# Patient Record
Sex: Male | Born: 1960 | Race: Black or African American | Hispanic: No | Marital: Married | Smoking: Never smoker
Health system: Southern US, Community
[De-identification: ages and names within clinical notes are randomized; demographics above are authoritative.]

## PROBLEM LIST (undated history)

## (undated) DIAGNOSIS — E785 Hyperlipidemia, unspecified: Secondary | ICD-10-CM

## (undated) DIAGNOSIS — I2510 Atherosclerotic heart disease of native coronary artery without angina pectoris: Secondary | ICD-10-CM

## (undated) DIAGNOSIS — R931 Abnormal findings on diagnostic imaging of heart and coronary circulation: Secondary | ICD-10-CM

## (undated) DIAGNOSIS — I10 Essential (primary) hypertension: Secondary | ICD-10-CM

## (undated) HISTORY — PX: BACK SURGERY: SHX140

## (undated) HISTORY — DX: Essential (primary) hypertension: I10

## (undated) HISTORY — DX: Abnormal findings on diagnostic imaging of heart and coronary circulation: R93.1

## (undated) HISTORY — DX: Atherosclerotic heart disease of native coronary artery without angina pectoris: I25.10

## (undated) HISTORY — DX: Hyperlipidemia, unspecified: E78.5

---

## 1999-01-10 ENCOUNTER — Encounter: Payer: Self-pay | Admitting: Cardiology

## 1999-01-10 ENCOUNTER — Ambulatory Visit (HOSPITAL_COMMUNITY): Admission: RE | Admit: 1999-01-10 | Discharge: 1999-01-10 | Payer: Self-pay | Admitting: Cardiology

## 1999-10-23 ENCOUNTER — Ambulatory Visit (HOSPITAL_COMMUNITY): Admission: RE | Admit: 1999-10-23 | Discharge: 1999-10-23 | Payer: Self-pay | Admitting: Cardiology

## 1999-10-23 ENCOUNTER — Encounter: Payer: Self-pay | Admitting: Cardiology

## 2001-12-24 ENCOUNTER — Encounter (INDEPENDENT_AMBULATORY_CARE_PROVIDER_SITE_OTHER): Payer: Self-pay | Admitting: Specialist

## 2001-12-24 ENCOUNTER — Encounter: Payer: Self-pay | Admitting: Specialist

## 2001-12-24 ENCOUNTER — Inpatient Hospital Stay (HOSPITAL_COMMUNITY): Admission: RE | Admit: 2001-12-24 | Discharge: 2001-12-25 | Payer: Self-pay | Admitting: Specialist

## 2003-05-31 ENCOUNTER — Ambulatory Visit (HOSPITAL_COMMUNITY): Admission: RE | Admit: 2003-05-31 | Discharge: 2003-05-31 | Payer: Self-pay | Admitting: Cardiology

## 2003-05-31 ENCOUNTER — Encounter: Payer: Self-pay | Admitting: Cardiology

## 2006-05-16 ENCOUNTER — Encounter: Admission: RE | Admit: 2006-05-16 | Discharge: 2006-05-16 | Payer: Self-pay | Admitting: *Deleted

## 2007-09-19 ENCOUNTER — Encounter: Admission: RE | Admit: 2007-09-19 | Discharge: 2007-09-19 | Payer: Self-pay | Admitting: Cardiology

## 2010-09-24 ENCOUNTER — Encounter: Payer: Self-pay | Admitting: Cardiology

## 2011-01-19 NOTE — H&P (Signed)
[facility]  Patient:    Roberto Miranda, Roberto Miranda Visit Number: [id number] MRN: [id number]          Service Type: Attending:  Javier Docker, M.D. Dictated by:   Sammuel Cooper. Mahar, P.A. Adm. Date:  12/24/01                           History and Physical  DATE OF BIRTH:  26-Feb-1961  CHIEF COMPLAINT:  Back pain and left leg paresthesias.  HISTORY OF PRESENT ILLNESS:  The patient is a 50 year old with back pain and leg "tingling" since the end of 2002.  He injured it while working at [employer].  He has failed conservative management.  It is interfering with his activities of daily living and work.  The risks and benefits of the proposed surgery were discussed with the patient by Dr. Jene Every, as well as myself.  He indicated understanding and wishes to proceed.  ALLERGIES:  No known drug allergies.  MEDICATIONS: 1. Celebrex 200 mg p.o. p.r.n. 2. Vicodin p.r.n.  PAST MEDICAL HISTORY: 1. Hypertension, for which he is no longer medicated secondary to hypotensive    episodes. 2. Hiatal hernia.  PAST SURGICAL HISTORY:  None.  SOCIAL HISTORY:  Denies tobacco use, denies alcohol use.  He is married, has two children, ages 13 and 2.  He works as a Loss adjuster, chartered.  FAMILY HISTORY:  Father deceased at age 2 secondary to lung cancer.  Mother alive at age 87 with diabetes and a history of stroke.  REVIEW OF SYSTEMS:  The patient denies any fevers, chills, night sweats, or bleeding tendencies.  CNS:  Denies any blurred vision, double vision, headaches, seizure, or paralysis.  CARDIOVASCULAR:  Denies any chest pain, angina, orthopnea, claudication, or palpitations.  PULMONARY:  Denies any shortness of breath, productive cough, or hemoptysis.  GASTROINTESTINAL: Denies any nausea, vomiting, constipation, diarrhea, melena, or bloody stool. GENITOURINARY:  Denies any dysuria, hematuria, or discharge.  MUSCULOSKELETAL: As per HPI.  PHYSICAL EXAMINATION:  VITAL  SIGNS:  Blood pressure 132/86, respirations 16 and unlabored, pulse 80 and regular.  GENERAL:  The patient is a 50 year old male.  He is alert and oriented.  In no acute distress.  Well-nourished, well-groomed.  Appears stated age.  Pleasant and cooperative to exam.  HEENT:  Head normocephalic, atraumatic.  Pupils equal, round, and reactive to light.  Extraocular movements intact.  Nares patent bilaterally.  Pharynx is clear, without any erythema or exudate.  NECK:  Soft, supple to palpation.  No bruits appreciated.  No lymphadenopathy noted.  No thyromegaly noted.  CHEST:  Clear to auscultation bilaterally.  No rales, rhonchi, stridor, wheezes, or friction rubs appreciated.  BREASTS:  Not pertinent, not performed.  HEART:  Regular S1, S2.  Regular rate and rhythm.  No murmurs, gallops, or rubs noted.  ABDOMEN:  Soft to palpation.  Positive bowel sounds throughout.  Nontender, nondistended.  No organomegaly noted.  GENITOURINARY:  Not pertinent, not performed.  EXTREMITIES:  Bilateral lower extremities with motor function grossly intact. Sensation is intact to light touch.  Posterior tibialis pulses are intact and equal bilaterally.  SKIN:  Intact.  Without any lesions or rashes.  LABORATORY DATA:  MRI from October 2002, shows a large disk herniation central and to the left with significant protrusion into the L5-S1 space.  IMPRESSION: 1. Herniated nucleus pulposus L5-S1. 2. Hypertension, not currently medicated. 3. Hiatal hernia.  PLAN:  Admit to [facility] on December 24, 2001, to Dr. Jene Every for a microdiskectomy at L5-S1. Dictated by:   Sammuel Cooper. Mahar, P.A. Attending:  Javier Docker, M.D. DD:  12/18/01 TD:  12/19/01 Job: [id number]

## 2011-01-19 NOTE — Op Note (Signed)
[facility]  Patient:    JENS, SIEMS Visit Number: [id number] MRN: [id number]          Service Type: SUR Location: [id number] Attending Physician:  Pierce Crane Dictated by:   Javier Docker, M.D. Proc. Date: 12/24/01 Admit Date:  12/24/2001                             Operative Report  PREOPERATIVE DIAGNOSIS:   Herniated nucleus pulposus, degenerative disk disease, lateral recess stenosis L5-S1.  POSTOPERATIVE DIAGNOSIS:  Herniated nucleus pulposus, degenerative disk disease, lateral recess stenosis L5-S1.  OPERATION:  Bilateral recess decompression, bilateral microdiskectomy.  SURGEON:  Javier Docker, M.D.  ASSISTANT:  Ottie Glazier. Wynona Neat, P.A.-C.  ANESTHESIA:  General.  INDICATIONS:  This is a 50 year old with refractory right lower extremity radicular pain.  MRI demonstrated degenerative disk disease and lateral recess stenosis and large central HNP.  Operative intervention is indicated for decompression of the S1 nerve root and lateral recess with bilateral microdiskectomy, lateral recess decompression and a hemilaminotomy.  Risks and benefits have been discussed including bleeding, infection, need for further surgery, CSF leak, epidural fibrosis, need for fusion in the future, etc.  DESCRIPTION OF PROCEDURE:  Patient in the supine position.  After the adequate administration of general anesthesia, 1 g of Kefzol, the patient was placed prone on the [place] frame.  All bony prominences were well padded.  The lumbar region was prepped and draped in the usual sterile fashion.  Two 18 gauge spinal needles were utilized to localize the L5-S1 interspace and confirmed with x-ray.  Incision was made from the spinous process of L5-S1 and the subcutaneous tissue was dissected with electrocautery to achieve hemostasis.  The tensor lata fascia was identified and divided on the outline of the inner spinous ligament.  The  paraspinous muscles were elevated from the lamina of L5 and S1.  McCullough retractor was placed.  The operating microscope was draped and brought into the surgical field.  Confirmatory radiographs were obtained with the DErrico in the intralaminar space. Ligament of flavum was detached in the cephalad edge utilizing straight curet. Hemilaminotomy was performed with 2 and 3 mm Kerrisons.  Following this a neural padding was placed beneath the ligamentum of flavum.  This was removed from the interspace.  There was hypertrophic ligament of flavum laterally. There was a slightly hypertrophic facet.  This was decompressed with a 2 mm Kerrison.  Nerve root and vessel was displaced into the lateral recess and gently mobilized immediately.  Herniated nucleus pulposus on the right was noted central and to the right.  Bipolar electrocautery was utilized to achieve hemostasis.  Epidural venous plexus was cauterized.  Hemilaminotomy was performed.  A copious amount of disk material was removed from the disk space and from beneath the subannular space.  Checking beneath the thecal sac, axilla, root of L5 and S1 foramen, S1 foraminotomy was performed.  This decompressed the S1 nerve root.  It was found to be freely mobile.  Finally, in a similar fashion we approached the left side and the intralaminar space, decompressed the lateral recess, mobilized the S1 nerve root and HNP found here.  Annulotomy produced additional herniated material from this side and from beneath the center.  We checked the axilla and the foramen.  They were widely patent.  Following this decompression, electrocautery was utilized to achieve hemostasis.  We then turned to the right and re-examined  the space. There was a fragment that was obtained.  This was removed.  I copiously irrigated the disk space bilaterally.  I reinspected the root.  It was unencumbered and freely mobile bilaterally.  L5-S1 were found to be widely. There  was an osteophytic ridge at the posterior aspect of L5 and S1 but it was not found to be significantly impinging.  No residual disk material was noted. No CSF or active bleeding.  Thrombin soaked Gelfoam was placed in the laminotomy defects bilaterally.  Patellar retractor was removed.  The paraspinous muscles were retracted.  There was no active bleeding. The dorsal lumbar fascia was then reapproximated with #1 Vicryl interrupted figure-of-eight sutures.  Subcutaneous tissues were reapproximated with interrupted 2-0 subcuticular sutures.  The skin was reapproximated with 4-0 subcuticular Prolene and Steri-Strips.  Sterile dressing applied.  The patient was placed supine on the hospital bed and extubated without difficulty and transported to the recovery room in satisfactory condition.  The patient tolerated the procedure well with no complications. Dictated by:   Javier Docker, M.D. Attending Physician:  Pierce Crane DD:  12/24/01 TD:  12/24/01 Job: [id number]

## 2011-08-04 ENCOUNTER — Other Ambulatory Visit: Payer: Self-pay | Admitting: Cardiology

## 2011-10-15 ENCOUNTER — Other Ambulatory Visit: Payer: Self-pay | Admitting: Cardiology

## 2013-08-03 ENCOUNTER — Telehealth: Payer: Self-pay

## 2013-08-03 NOTE — Telephone Encounter (Signed)
This is I/A account, please put in medman, pull chart

## 2013-08-03 NOTE — Telephone Encounter (Signed)
Patient lost his dot card and needs a replacment card when finished please call him at [phone]

## 2013-10-06 ENCOUNTER — Encounter (INDEPENDENT_AMBULATORY_CARE_PROVIDER_SITE_OTHER): Payer: Self-pay | Admitting: General Surgery

## 2013-10-09 ENCOUNTER — Telehealth (INDEPENDENT_AMBULATORY_CARE_PROVIDER_SITE_OTHER): Payer: Self-pay

## 2013-10-09 ENCOUNTER — Encounter (INDEPENDENT_AMBULATORY_CARE_PROVIDER_SITE_OTHER): Payer: Self-pay | Admitting: General Surgery

## 2013-10-09 ENCOUNTER — Ambulatory Visit (INDEPENDENT_AMBULATORY_CARE_PROVIDER_SITE_OTHER): Payer: BC Managed Care – PPO | Admitting: General Surgery

## 2013-10-09 VITALS — BP 128/82 | HR 78 | Resp 18 | Ht 70.0 in | Wt 188.0 lb

## 2013-10-09 DIAGNOSIS — D1739 Benign lipomatous neoplasm of skin and subcutaneous tissue of other sites: Secondary | ICD-10-CM

## 2013-10-09 DIAGNOSIS — D1720 Benign lipomatous neoplasm of skin and subcutaneous tissue of unspecified limb: Secondary | ICD-10-CM

## 2013-10-09 NOTE — Progress Notes (Signed)
Patient ID: Roberto Miranda, male   DOB: Jan 22, 1961, 53 y.o.   MRN: [id number]  Chief Complaint  Patient presents with  . Other    Eval lipoma rt shoulder    HPI Roberto Miranda is a 53 y.o. male.   HPI 53 yo AAM referred by Dr Antony Contras for evaluation of Right shoulder soft tissue mass. Patient states area has been there for several years however over the past several months he thinks that it may have gotten slowly enlarged. It is now causing him some discomfort. It causes discomfort around the area he also complains of some tingling going down his entire right arm. It tends to bother him only with certain movements when he raises his arm outward. He denies any fever, chills, nausea, vomiting, weight loss, night sweats. He denies any trauma to area. It has never drained anything. He denies any other soft tissue masses Past Medical History  Diagnosis Date  . Hypertension     Past Surgical History  Procedure Laterality Date  . Back surgery      History reviewed. No pertinent family history.  Social History History  Substance Use Topics  . Smoking status: Never Smoker   . Smokeless tobacco: Not on file  . Alcohol Use: No    No Known Allergies  Current Outpatient Prescriptions  Medication Sig Dispense Refill  . aspirin 81 MG tablet Take 81 mg by mouth daily.      . naproxen (NAPROSYN) 250 MG tablet Take by mouth 2 (two) times daily with a meal.      . olmesartan (BENICAR) 20 MG tablet Take 20 mg by mouth daily.       No current facility-administered medications for this visit.    Review of Systems Review of Systems  Constitutional: Negative for fever, chills, appetite change and unexpected weight change.  HENT: Negative for congestion and trouble swallowing.   Eyes: Negative for visual disturbance.  Respiratory: Negative for chest tightness and shortness of breath.   Cardiovascular: Negative for chest pain and leg swelling.       No PND, no orthopnea, no DOE    Gastrointestinal:       See HPI  Genitourinary: Negative for dysuria and hematuria.  Musculoskeletal: Negative.   Skin: Negative for rash.  Neurological: Negative for seizures and speech difficulty.  Hematological: Does not bruise/bleed easily.  Psychiatric/Behavioral: Negative for behavioral problems and confusion.    Blood pressure 128/82, pulse 78, resp. rate 18, height 5\' 10"  (1.778 m), weight 188 lb (85.276 kg).  Physical Exam Physical Exam  Constitutional: He is oriented to person, place, and time. He appears well-developed and well-nourished. No distress.  HENT:  Head: Normocephalic and atraumatic.  Right Ear: External ear normal.  Left Ear: External ear normal.  Eyes: Conjunctivae are normal. No scleral icterus.  Neck: Normal range of motion. Neck supple. No tracheal deviation present. No thyromegaly present.  Cardiovascular: Normal rate, normal heart sounds and intact distal pulses.   Pulmonary/Chest: Effort normal and breath sounds normal. No respiratory distress. He has no wheezes.    Right ant shoulder soft tissue mass, well circumscribed. Soft, NT. 4 x 4cm. No overlying skin changes. mobile  Musculoskeletal: Normal range of motion. He exhibits no edema and no tenderness.  Lymphadenopathy:    He has no cervical adenopathy.    He has no axillary adenopathy.       Right: No supraclavicular adenopathy present.       Left: No supraclavicular adenopathy present.  Neurological: He is alert and oriented to person, place, and time. He exhibits normal muscle tone.  Skin: Skin is warm and dry. No rash noted. He is not diaphoretic. No erythema. No pallor.  Psychiatric: He has a normal mood and affect. His behavior is normal. Judgment and thought content normal.    Data Reviewed Dr Laqueta Linden office note  Assessment    Right shoulder lipoma     Plan    We discussed the etiology and management of lipomas. The patient was given educational material. We discussed that the  majority of lipomas are benign although on a rare occasion it can be malignant.   We discussed observation versus surgical excision. We discussed the risks and benefits of surgery including but not limited to bleeding, infection, injury to surrounding structures, scarring, cosmetic concerns, blood clot formation, anesthesia issues, possible recurrence, and the typical postoperative course.   The patient has elected to proceed with EXCISION OF RIGHT SHOULDER SUBCUTANEOUS MASS  I did advise the patient that there is a small chance of excision the mass may not ameliorate his symptoms   Roberto Ruff. Redmond Pulling, MD, FACS General, Bariatric, & Minimally Invasive Surgery [facility], [place]        [facility] M 10/09/2013, 9:57 AM

## 2013-10-09 NOTE — Patient Instructions (Signed)

## 2013-10-09 NOTE — Telephone Encounter (Signed)
Message copied by Illene Regulus on Fri Oct 09, 2013  1:56 PM ------      Message from: Tennis Ship      Created: Fri Oct 09, 2013  1:14 PM       I met with this patient to schedule his surgery today. I tried to verify his BCBC. The e-verify on epic would not work. I went on [employer] and the website was not showing and detailed benefits other then he is an active member. I called BCBS also. I went through the automated portion entering in our NPI, and subscriber ID. The automated message said transferring to a customer service representative. Then another message came on and said... Due to a high number of phone calls we can not take you call at this time, Sorry for the inconvenience and HUNG UP ON ME.  I called the patient in my office and let him know I couldn't get benefits at this time. I let him know the estimate cost of Dr. Dois Davenport fees of 797.00 and that we take a 50% deposit when scheduling. Since I do not have his benefits I had nothing to go by. He wanted to schedule so I let him know I would take a $100.00 deposit in good faith and would call him as soon as I got his benefits. He was ok with this. Dr. Redmond Pulling and Marlowe Kays are aware of this.            Thanks,      Steph ------

## 2013-11-06 ENCOUNTER — Other Ambulatory Visit (INDEPENDENT_AMBULATORY_CARE_PROVIDER_SITE_OTHER): Payer: Self-pay | Admitting: General Surgery

## 2013-11-06 ENCOUNTER — Other Ambulatory Visit (INDEPENDENT_AMBULATORY_CARE_PROVIDER_SITE_OTHER): Payer: Self-pay | Admitting: *Deleted

## 2013-11-06 DIAGNOSIS — D1739 Benign lipomatous neoplasm of skin and subcutaneous tissue of other sites: Secondary | ICD-10-CM

## 2013-11-06 MED ORDER — OXYCODONE-ACETAMINOPHEN 5-325 MG PO TABS
1.0000 | ORAL_TABLET | ORAL | Status: AC | PRN
Start: 2013-11-06 — End: 2014-11-06

## 2019-12-10 ENCOUNTER — Other Ambulatory Visit: Payer: Self-pay

## 2019-12-10 ENCOUNTER — Ambulatory Visit (INDEPENDENT_AMBULATORY_CARE_PROVIDER_SITE_OTHER): Payer: BC Managed Care – PPO | Admitting: Allergy and Immunology

## 2019-12-10 ENCOUNTER — Encounter: Payer: Self-pay | Admitting: Allergy and Immunology

## 2019-12-10 VITALS — BP 130/82 | HR 79 | Temp 98.3°F | Resp 20 | Ht 69.5 in | Wt 186.0 lb

## 2019-12-10 DIAGNOSIS — H04123 Dry eye syndrome of bilateral lacrimal glands: Secondary | ICD-10-CM | POA: Insufficient documentation

## 2019-12-10 DIAGNOSIS — J310 Chronic rhinitis: Secondary | ICD-10-CM | POA: Insufficient documentation

## 2019-12-10 DIAGNOSIS — T485X5D Adverse effect of other anti-common-cold drugs, subsequent encounter: Secondary | ICD-10-CM | POA: Diagnosis not present

## 2019-12-10 DIAGNOSIS — K9049 Malabsorption due to intolerance, not elsewhere classified: Secondary | ICD-10-CM

## 2019-12-10 DIAGNOSIS — T485X5A Adverse effect of other anti-common-cold drugs, initial encounter: Secondary | ICD-10-CM

## 2019-12-10 MED ORDER — XHANCE 93 MCG/ACT NA EXHU: 2.0000 | INHALANT_SUSPENSION | Freq: Two times a day (BID) | NASAL | 12 refills | Status: DC

## 2019-12-10 NOTE — Assessment & Plan Note (Addendum)
   Discontinue oxymetazoline (Afrin).  Prednisone has been provided, 40 mg x3 days, 20 mg x1 day, 10 mg x1 day, then stop.  [name] has been prescribed (as above).

## 2019-12-10 NOTE — Assessment & Plan Note (Signed)
The patient's history suggests lactose intolerance. Skin tests to select food allergens were negative today. The negative predictive value of food allergen skin testing is excellent (approximately 95%). While this does not appear to be an IgE mediated issue, skin testing does not rule out food intolerances or cell-mediated enteropathies which may lend to GI symptoms. These etiologies are suggested when elimination of the responsible food leads to symptom resolution and re-introduction of the food is followed by the return of symptoms.   The patient has been encouraged to keep a careful symptom/food journal and eliminate any food suspected of correlating with symptoms. Should symptoms concerning for anaphylaxis arise, 911 is to be called immediately.  If GI symptoms persist or progress, gastroenterologist evaluation may be warranted.

## 2019-12-10 NOTE — Progress Notes (Signed)
New Patient Note  RE: Roberto Miranda MRN: [id number] DOB: Jan 24, 1961 Date of Office Visit: 12/10/2019  Referring provider: Antony Contras, MD Primary care provider: Antony Contras, MD  Chief Complaint: Nasal Congestion and Sinus Problem   History of present illness: Roberto Miranda is a 59 y.o. male seen today in consultation requested by Antony Contras, MD.  He complains of persistent nasal congestion which has been a problem for many years.  The nasal congestion oftentimes wakes him up in the middle the night.  No significant seasonal symptom variation has been noted nor have specific environmental triggers been identified.  Over the past month or so he has been using over-the-counter oxymetazoline every night.  He also experiences thick postnasal drainage.  He reports that on occasion he wakes up in the middle the night gagging because of thick postnasal drainage.  He denies ocular pruritus, however does experience dry, irritated eyes on occasion. Roberto Miranda reports that if he consumes cow's milk he experiences abdominal discomfort, diarrhea, and migraine headaches.  Reports that he is able to consume cheese, however cannot drink whole cows milk.  He does not recall if he has tried, or had relief with, Lactaid products.  Symptoms with the consumption of cow's milk has been a problem for many years.  Assessment and plan: Chronic rhinitis All seasonal and perennial aeroallergen skin tests are negative despite a positive histamine control.  Intranasal steroids, intranasal antihistamines, and first generation antihistamines are effective for symptoms associated with non-allergic rhinitis, whereas second generation antihistamines such as cetirizine (Zyrtec), loratadine (Claritin) and fexofenadine (Allegra) have been found to be ineffective for this condition.  A prescription has been provided for [facility], 2 actuations per nostril twice a day. Proper technique has been discussed and demonstrated.     Nasal saline lavage (NeilMed) has been recommended as needed and prior to medicated nasal sprays along with instructions for proper administration.  For thick post nasal drainage, add guaifenesin 1200 mg (Mucinex Maximum Strength)  twice daily as needed with adequate hydration as discussed.  Rhinitis medicamentosa  Discontinue oxymetazoline (Afrin).  Prednisone has been provided, 40 mg x3 days, 20 mg x1 day, 10 mg x1 day, then stop.  [name] has been prescribed (as above).  Intolerance, food The patient's history suggests lactose intolerance. Skin tests to select food allergens were negative today. The negative predictive value of food allergen skin testing is excellent (approximately 95%). While this does not appear to be an IgE mediated issue, skin testing does not rule out food intolerances or cell-mediated enteropathies which may lend to GI symptoms. These etiologies are suggested when elimination of the responsible food leads to symptom resolution and re-introduction of the food is followed by the return of symptoms.   The patient has been encouraged to keep a careful symptom/food journal and eliminate any food suspected of correlating with symptoms. Should symptoms concerning for anaphylaxis arise, 911 is to be called immediately.  If GI symptoms persist or progress, gastroenterologist evaluation may be warranted.  Dry eyes  I have recommended eye lubricant drops (i.e., Natural Tears) as needed.  If this problem persists or progresses, further evaluation by an ophthalmologist may be warranted.   Meds ordered this encounter  Medications  . Fluticasone Propionate (XHANCE) 93 MCG/ACT EXHU    Sig: Place 2 puffs into both nostrils in the morning and at bedtime.    Dispense:  16 mL    Refill:  12    Diagnostics: Environmental epicutaneous testing: Negative despite a positive histamine  control. Environmental intradermal testing: Negative. Food allergen testing: Negative despite  a positive histamine control.  Physical examination: Blood pressure 130/82, pulse 79, temperature 98.3 F (36.8 C), temperature source Oral, resp. rate 20, height 5' 9.5" (1.765 m), weight 186 lb (84.4 kg), SpO2 97 %.  General: Alert, interactive, in no acute distress. HEENT: TMs pearly gray, turbinates moderately edematous with clear discharge, post-pharynx moderately erythematous. Neck: Supple without lymphadenopathy. Lungs: Clear to auscultation without wheezing, rhonchi or rales. CV: Normal S1, S2 without murmurs. Abdomen: Nondistended, nontender. Skin: Warm and dry, without lesions or rashes. Extremities:  No clubbing, cyanosis or edema. Neuro:   Grossly intact.  Review of systems:  Review of systems negative except as noted in HPI / PMHx or noted below: Review of Systems  Constitutional: Negative.   HENT: Negative.   Eyes: Negative.   Respiratory: Negative.   Cardiovascular: Negative.   Gastrointestinal: Negative.   Genitourinary: Negative.   Musculoskeletal: Negative.   Skin: Negative.   Neurological: Negative.   Endo/Heme/Allergies: Negative.   Psychiatric/Behavioral: Negative.     Past medical history:  Past Medical History:  Diagnosis Date  . Hypertension     Past surgical history:  Past Surgical History:  Procedure Laterality Date  . BACK SURGERY      Family history: Family History  Problem Relation Age of Onset  . Allergic rhinitis Father   . Allergic rhinitis Brother   . Eczema Neg Hx   . Urticaria Neg Hx   . Immunodeficiency Neg Hx   . Angioedema Neg Hx   . Asthma Neg Hx   . Atopy Neg Hx     Social history: Social History   Socioeconomic History  . Marital status: Married    Spouse name: Not on file  . Number of children: Not on file  . Years of education: Not on file  . Highest education level: Not on file  Occupational History  . Not on file  Tobacco Use  . Smoking status: Never Smoker  . Smokeless tobacco: Never Used  Substance  and Sexual Activity  . Alcohol use: No  . Drug use: No  . Sexual activity: Not on file  Other Topics Concern  . Not on file  Social History Narrative  . Not on file   Social Determinants of Health   Financial Resource Strain:   . Difficulty of Paying Living Expenses:   Food Insecurity:   . Worried About Charity fundraiser in the Last Year:   . Arboriculturist in the Last Year:   Transportation Needs:   . Film/video editor (Medical):   Marland Kitchen Lack of Transportation (Non-Medical):   Physical Activity:   . Days of Exercise per Week:   . Minutes of Exercise per Session:   Stress:   . Feeling of Stress :   Social Connections:   . Frequency of Communication with Friends and Family:   . Frequency of Social Gatherings with Friends and Family:   . Attends Religious Services:   . Active Member of Clubs or Organizations:   . Attends Archivist Meetings:   Marland Kitchen Marital Status:   Intimate Partner Violence:   . Fear of Current or Ex-Partner:   . Emotionally Abused:   Marland Kitchen Physically Abused:   . Sexually Abused:     Environmental History: The patient lives in a 58 year old house with carpeting throughout, gas heat, and central air.  There is no known mold/water damage in the home.  There is  a rabbit in the home which does not have access to his bedroom.  He is a non-smoker.  Current Outpatient Medications  Medication Sig Dispense Refill  . olmesartan (BENICAR) 20 MG tablet Take 20 mg by mouth daily.    . Fluticasone Propionate (XHANCE) 93 MCG/ACT EXHU Place 2 puffs into both nostrils in the morning and at bedtime. 16 mL 12   No current facility-administered medications for this visit.    Known medication allergies: No Known Allergies  I appreciate the opportunity to take part in Kristoph's care. Please do not hesitate to contact me with questions.  Sincerely,   R. Edgar Frisk, MD

## 2019-12-10 NOTE — Patient Instructions (Addendum)
Chronic rhinitis All seasonal and perennial aeroallergen skin tests are negative despite a positive histamine control.  Intranasal steroids, intranasal antihistamines, and first generation antihistamines are effective for symptoms associated with non-allergic rhinitis, whereas second generation antihistamines such as cetirizine (Zyrtec), loratadine (Claritin) and fexofenadine (Allegra) have been found to be ineffective for this condition.  A prescription has been provided for [facility], 2 actuations per nostril twice a day. Proper technique has been discussed and demonstrated.    Nasal saline lavage (NeilMed) has been recommended as needed and prior to medicated nasal sprays along with instructions for proper administration.  For thick post nasal drainage, add guaifenesin 1200 mg (Mucinex Maximum Strength)  twice daily as needed with adequate hydration as discussed.  Rhinitis medicamentosa  Discontinue oxymetazoline (Afrin).  Prednisone has been provided, 40 mg x3 days, 20 mg x1 day, 10 mg x1 day, then stop.  [name] has been prescribed (as above).  Intolerance, food The patient's history suggests lactose intolerance. Skin tests to select food allergens were negative today. The negative predictive value of food allergen skin testing is excellent (approximately 95%). While this does not appear to be an IgE mediated issue, skin testing does not rule out food intolerances or cell-mediated enteropathies which may lend to GI symptoms. These etiologies are suggested when elimination of the responsible food leads to symptom resolution and re-introduction of the food is followed by the return of symptoms.   The patient has been encouraged to keep a careful symptom/food journal and eliminate any food suspected of correlating with symptoms. Should symptoms concerning for anaphylaxis arise, 911 is to be called immediately.  If GI symptoms persist or progress, gastroenterologist evaluation may be  warranted.  Dry eyes  I have recommended eye lubricant drops (i.e., Natural Tears) as needed.  If this problem persists or progresses, further evaluation by an ophthalmologist may be warranted.   Return in about 4 months (around 04/10/2020), or if symptoms worsen or fail to improve.

## 2019-12-10 NOTE — Assessment & Plan Note (Signed)
   I have recommended eye lubricant drops (i.e., Natural Tears) as needed.  If this problem persists or progresses, further evaluation by an ophthalmologist may be warranted.

## 2019-12-10 NOTE — Assessment & Plan Note (Addendum)
All seasonal and perennial aeroallergen skin tests are negative despite a positive histamine control.  Intranasal steroids, intranasal antihistamines, and first generation antihistamines are effective for symptoms associated with non-allergic rhinitis, whereas second generation antihistamines such as cetirizine (Zyrtec), loratadine (Claritin) and fexofenadine (Allegra) have been found to be ineffective for this condition.  A prescription has been provided for [facility], 2 actuations per nostril twice a day. Proper technique has been discussed and demonstrated.    Nasal saline lavage (NeilMed) has been recommended as needed and prior to medicated nasal sprays along with instructions for proper administration.  For thick post nasal drainage, add guaifenesin 1200 mg (Mucinex Maximum Strength)  twice daily as needed with adequate hydration as discussed.

## 2020-03-08 ENCOUNTER — Ambulatory Visit: Payer: Self-pay

## 2020-03-08 ENCOUNTER — Other Ambulatory Visit: Payer: Self-pay | Admitting: Family Medicine

## 2020-03-08 ENCOUNTER — Other Ambulatory Visit: Payer: Self-pay

## 2020-03-08 DIAGNOSIS — M542 Cervicalgia: Secondary | ICD-10-CM

## 2020-03-18 ENCOUNTER — Other Ambulatory Visit: Payer: Self-pay | Admitting: Family Medicine

## 2020-03-18 ENCOUNTER — Other Ambulatory Visit: Payer: Self-pay

## 2020-03-18 ENCOUNTER — Ambulatory Visit: Payer: Self-pay

## 2020-03-18 DIAGNOSIS — M545 Low back pain, unspecified: Secondary | ICD-10-CM

## 2020-04-11 ENCOUNTER — Ambulatory Visit: Payer: BC Managed Care – PPO | Admitting: Allergy and Immunology

## 2020-04-18 ENCOUNTER — Encounter: Payer: Self-pay | Admitting: Family Medicine

## 2020-04-18 ENCOUNTER — Ambulatory Visit (INDEPENDENT_AMBULATORY_CARE_PROVIDER_SITE_OTHER): Payer: BC Managed Care – PPO | Admitting: Family Medicine

## 2020-04-18 ENCOUNTER — Other Ambulatory Visit: Payer: Self-pay

## 2020-04-18 VITALS — BP 120/70 | HR 79 | Temp 98.1°F | Resp 16

## 2020-04-18 DIAGNOSIS — K9049 Malabsorption due to intolerance, not elsewhere classified: Secondary | ICD-10-CM | POA: Diagnosis not present

## 2020-04-18 DIAGNOSIS — H04123 Dry eye syndrome of bilateral lacrimal glands: Secondary | ICD-10-CM | POA: Diagnosis not present

## 2020-04-18 DIAGNOSIS — J310 Chronic rhinitis: Secondary | ICD-10-CM

## 2020-04-18 NOTE — Patient Instructions (Addendum)
Rhinitis Continue Xhance 1-2 sprays in each nostril once a day as needed for nasal symptoms Remember to rotate to a different antihistamine about every 3 months. Some examples of over the counter antihistamines include Zyrtec (cetirizine), Xyzal (levocetirizine), Allegra (fexofenadine), and Claritin (loratidine).  Consider saline nasal rinses as needed for nasal symptoms. Use this before any medicated nasal sprays for best result  Dry eye Continue a lubricating eye drop such as Natural Tears. If your symptoms persist, a visit with opthalmology may be warranted.   Call the clinic if this treatment plan is not working well for you  Follow up in 6 months or sooner if needed.

## 2020-04-18 NOTE — Progress Notes (Addendum)
[address] [place] [address] Dept: [phone]  FOLLOW UP NOTE  Patient ID: Roberto Miranda, male    DOB: 18-Jul-1961  Age: 59 y.o. MRN: [id number] Date of Office Visit: 04/18/2020  Assessment  Chief Complaint: Allergic Rhinitis   HPI Roberto Miranda is a 59 year old male who presents to the clinic for a follow up visit. He was last seen in this clinic on 12/10/2019 by Dr. Verlin Fester for evaluation of chronic rhinitis, rhinitis medicamentosa, food intolerance to lactose, and dry eyes. At today's visit, he reports that allergic rhinitis has been well controlled with Xhance 1-2 sprays in each nostril once a day and occasional nasal saline rinses. He reports that he continues to avoid oxymetazoline.  He denies use of an antihistamine at this time.  He reports that he consumes products that are lactose free with no adverse effects. He reports he does experience dry, itchy eyes occasionally for which he uses a lubricating eye drop with relief of symptoms. His current medications are lsited in the chart.    Drug Allergies:  No Known Allergies  Physical Exam: BP 120/70 (BP Location: Left Arm, Patient Position: Sitting, Cuff Size: Normal)   Pulse 79   Temp 98.1 F (36.7 C) (Oral)   Resp 16   SpO2 98%    Physical Exam Vitals reviewed.  Constitutional:      Appearance: Normal appearance.  HENT:     Head: Normocephalic and atraumatic.     Right Ear: Tympanic membrane normal.     Left Ear: Tympanic membrane normal.     Nose:     Comments: Bilateral nares normal.  Pharynx normal.  Ears normal.  Eyes normal.    Mouth/Throat:     Pharynx: Oropharynx is clear.  Eyes:     Conjunctiva/sclera: Conjunctivae normal.  Cardiovascular:     Rate and Rhythm: Normal rate and regular rhythm.     Heart sounds: Normal heart sounds. No murmur heard.   Pulmonary:     Effort: Pulmonary effort is normal.     Breath sounds: Normal breath sounds.     Comments: Lungs clear to lungs clear to  auscultation Musculoskeletal:        General: Normal range of motion.     Cervical back: Normal range of motion and neck supple.  Skin:    General: Skin is warm and dry.  Neurological:     Mental Status: He is alert and oriented to person, place, and time.  Psychiatric:        Mood and Affect: Mood normal.        Behavior: Behavior normal.        Thought Content: Thought content normal.        Judgment: Judgment normal.      Assessment and Plan: 1. Chronic rhinitis   2. Dry eyes   3. Intolerance, food     Patient Instructions  Rhinitis Continue Xhance 1-2 sprays in each nostril once a day as needed for nasal symptoms Remember to rotate to a different antihistamine about every 3 months. Some examples of over the counter antihistamines include Zyrtec (cetirizine), Xyzal (levocetirizine), Allegra (fexofenadine), and Claritin (loratidine).  Consider saline nasal rinses as needed for nasal symptoms. Use this before any medicated nasal sprays for best result  Dry eye Continue a lubricating eye drop such as Natural Tears. If your symptoms persist, a visit with opthalmology may be warranted.   Call the clinic if this treatment plan is not working well  for you  Follow up in 6 months or sooner if needed.   Return in about 6 months (around 10/19/2020), or if symptoms worsen or fail to improve.    Thank you for the opportunity to care for this patient.  Please do not hesitate to contact me with questions.  Gareth Morgan, FNP [facility]  ________________________________________________  I have provided oversight concerning [facility]'s evaluation and treatment of this patient's health issues addressed during today's encounter.  I agree with the assessment and therapeutic plan as outlined in the note.   Signed,   R Edgar Frisk, MD

## 2020-05-26 ENCOUNTER — Other Ambulatory Visit: Payer: Self-pay

## 2020-05-26 MED ORDER — XHANCE 93 MCG/ACT NA EXHU: 2.0000 | INHALANT_SUSPENSION | Freq: Two times a day (BID) | NASAL | 12 refills | Status: AC

## 2020-10-17 NOTE — Patient Instructions (Incomplete)
Rhinitis Continue Xhance 1-2 sprays in each nostril once a day as needed for stuffy nos Remember to rotate to a different antihistamine about every 3 months. Some examples of over the counter antihistamines include Zyrtec (cetirizine), Xyzal (levocetirizine), Allegra (fexofenadine), and Claritin (loratidine).  You may use saline nasal rinses as needed for nasal symptoms. Use this before any medicated nasal sprays for best result  Dry eye Continue a lubricating eye drop such as Natural Tears. If your symptoms persist, a visit with opthalmology may be warranted.   Call the clinic if this treatment plan is not working well for you  Follow up in  months or sooner if needed.

## 2020-10-18 ENCOUNTER — Ambulatory Visit: Payer: BC Managed Care – PPO | Admitting: Family

## 2020-10-18 ENCOUNTER — Ambulatory Visit: Payer: BC Managed Care – PPO | Admitting: Allergy and Immunology

## 2021-03-02 ENCOUNTER — Other Ambulatory Visit: Payer: Self-pay | Admitting: Family Medicine

## 2021-03-02 DIAGNOSIS — R1011 Right upper quadrant pain: Secondary | ICD-10-CM

## 2021-03-03 ENCOUNTER — Other Ambulatory Visit: Payer: Self-pay

## 2021-03-03 ENCOUNTER — Ambulatory Visit
Admission: RE | Admit: 2021-03-03 | Discharge: 2021-03-03 | Disposition: A | Discharge status: Home / Self Care | Payer: BC Managed Care – PPO | Source: Ambulatory Visit | Attending: Family Medicine | Admitting: Family Medicine

## 2021-03-03 DIAGNOSIS — R1011 Right upper quadrant pain: Secondary | ICD-10-CM

## 2021-04-17 SURGERY — Surgical Case
Anesthesia: *Unknown

## 2021-10-05 ENCOUNTER — Emergency Department (HOSPITAL_COMMUNITY): Payer: BC Managed Care – PPO

## 2021-10-05 ENCOUNTER — Emergency Department (HOSPITAL_COMMUNITY)
Admission: EM | Admit: 2021-10-05 | Discharge: 2021-10-05 | Disposition: A | Discharge status: Home / Self Care | Payer: BC Managed Care – PPO | Attending: Emergency Medicine | Admitting: Emergency Medicine

## 2021-10-05 ENCOUNTER — Other Ambulatory Visit: Payer: Self-pay

## 2021-10-05 DIAGNOSIS — R002 Palpitations: Secondary | ICD-10-CM | POA: Insufficient documentation

## 2021-10-05 DIAGNOSIS — R42 Dizziness and giddiness: Secondary | ICD-10-CM | POA: Insufficient documentation

## 2021-10-05 LAB — BASIC METABOLIC PANEL
Anion gap: 11 (ref 5–15)
BUN: 10 mg/dL (ref 6–20)
CO2: 26 mmol/L (ref 22–32)
Calcium: 9.5 mg/dL (ref 8.9–10.3)
Chloride: 103 mmol/L (ref 98–111)
Creatinine, Ser: 0.95 mg/dL (ref 0.61–1.24)
GFR, Estimated: >60 mL/min (ref >60)
Glucose, Bld: 98 mg/dL (ref 70–99)
Potassium: 3.6 mmol/L (ref 3.5–5.1)
Sodium: 140 mmol/L (ref 135–145)

## 2021-10-05 LAB — CBC
HCT: 41.3 % (ref 39.0–52.0)
Hemoglobin: 14.8 g/dL (ref 13.0–17.0)
MCH: 31 pg (ref 26.0–34.0)
MCHC: 35.8 g/dL (ref 30.0–36.0)
MCV: 86.6 fL (ref 80.0–100.0)
Platelets: 256 10*3/uL (ref 150–400)
RBC: 4.77 MIL/uL (ref 4.22–5.81)
RDW: 11.7 % (ref 11.5–15.5)
WBC: 7.5 10*3/uL (ref 4.0–10.5)
nRBC: 0 % (ref 0.0–0.2)

## 2021-10-05 LAB — TROPONIN I (HIGH SENSITIVITY)
Troponin I (High Sensitivity): 3 ng/L (ref <18)
Troponin I (High Sensitivity): 3 ng/L (ref <18)

## 2021-10-05 LAB — D-DIMER, QUANTITATIVE: D-Dimer, Quant: <0.27 ug/mL-FEU (ref 0.00–0.50)

## 2021-10-05 NOTE — ED Notes (Signed)
Pt ambulatory to restroom with steady gait.

## 2021-10-05 NOTE — Discharge Instructions (Addendum)
Schedule appointment with the cardiology office for further evaluation.  Return as needed for worsening or recurrent symptoms.

## 2021-10-05 NOTE — ED Provider Notes (Signed)
[facility] EMERGENCY DEPARTMENT Provider Note   CSN: [id number] Arrival date & time: 10/05/21  1529     History  Chief Complaint  Patient presents with   Palpitations    Roberto Miranda is a 61 y.o. male.   Palpitations   Pt has history of HTN.   Pt started to feel while driving that his heart was racing on tuesday.  He felt lightheaded and as if he might pass out.  Pt has noticed that when he works out he gets lightheaded and might have to slow down.  No chest pain just a racing sensation.   No LOC.  Pt does work as a Geophysicist/field seismologist.  No leg swelling.  No history of blood clots.  He had another episode today so he came in for evaluation.  Home Medications Prior to Admission medications   Medication Sig Start Date End Date Taking? Authorizing Provider  Fluticasone Propionate (XHANCE) 93 MCG/ACT EXHU Place 2 puffs into both nostrils in the morning and at bedtime. 05/26/20   Dara Hoyer, FNP  ibuprofen (ADVIL) 600 MG tablet Take 600 mg by mouth every 6 (six) hours as needed. 02/15/20   [provider]  Ibuprofen-Famotidine (DUEXIS) 800-26.6 MG TABS Duexis 800 mg-26.6 mg tablet  Take 1 tablet 3 times a day by oral route.    [provider]  olmesartan-hydrochlorothiazide (BENICAR HCT) 40-12.5 MG tablet  04/09/20   [provider]      Allergies    Patient has no known allergies.    Review of Systems   Review of Systems  Constitutional:  Negative for fever.  Cardiovascular:  Positive for palpitations.   Physical Exam Updated Vital Signs BP 135/84    Pulse 72    Temp 98.1 F (36.7 C) (Oral)    Resp 20    SpO2 100%  Physical Exam Vitals and nursing note reviewed.  Constitutional:      General: He is not in acute distress.    Appearance: He is well-developed.  HENT:     Head: Normocephalic and atraumatic.     Right Ear: External ear normal.     Left Ear: External ear normal.  Eyes:     General: No scleral icterus.       Right eye: No  discharge.        Left eye: No discharge.     Conjunctiva/sclera: Conjunctivae normal.  Neck:     Trachea: No tracheal deviation.  Cardiovascular:     Rate and Rhythm: Normal rate and regular rhythm.  Pulmonary:     Effort: Pulmonary effort is normal. No respiratory distress.     Breath sounds: Normal breath sounds. No stridor. No wheezing or rales.  Abdominal:     General: Bowel sounds are normal. There is no distension.     Palpations: Abdomen is soft.     Tenderness: There is no abdominal tenderness. There is no guarding or rebound.  Musculoskeletal:        General: No tenderness or deformity.     Cervical back: Neck supple.  Skin:    General: Skin is warm and dry.     Findings: No rash.  Neurological:     General: No focal deficit present.     Mental Status: He is alert.     Cranial Nerves: No cranial nerve deficit (no facial droop, extraocular movements intact, no slurred speech).     Sensory: No sensory deficit.     Motor: No abnormal  muscle tone or seizure activity.     Coordination: Coordination normal.  Psychiatric:        Mood and Affect: Mood normal.    ED Results / Procedures / Treatments   Labs (all labs ordered are listed, but only abnormal results are displayed) Labs Reviewed  BASIC METABOLIC PANEL  CBC  D-DIMER, QUANTITATIVE  TROPONIN I (HIGH SENSITIVITY)  TROPONIN I (HIGH SENSITIVITY)    EKG EKG Interpretation  Date/Time:  Thursday October 05 2021 15:58:53 EST Ventricular Rate:  88 PR Interval:  146 QRS Duration: 84 QT Interval:  352 QTC Calculation: 425 R Axis:   32 Text Interpretation: Normal sinus rhythm Septal infarct , age undetermined Possible Lateral infarct , age undetermined Abnormal ECG When compared with ECG of 17-Dec-2001 13:21, No significant change since last tracing Confirmed by Dorie Rank [phone]) on 10/05/2021 7:42:54 PM  Radiology DG Chest 2 View  Result Date: 10/05/2021 CLINICAL DATA:  Palpitations, lightheadedness for several  days EXAM: CHEST - 2 VIEW COMPARISON:  None. FINDINGS: Frontal and lateral views of the chest demonstrate an unremarkable cardiac silhouette. No acute airspace disease, effusion, or pneumothorax. No acute bony abnormalities. IMPRESSION: 1. No acute intrathoracic process. Electronically Signed   By: Randa Ngo M.D.   On: 10/05/2021 17:07    Procedures Procedures    Medications Ordered in ED Medications - No data to display  ED Course/ Medical Decision Making/ A&P Clinical Course as of 10/05/21 2319  Thu Oct 05, 2021  2002 DG Chest 2 View CXR images and report reviewed.  Normal [JK]  7616 Basic metabolic panel Normal  [JK]  2003 CBC normal [JK]    Clinical Course User Index [JK] Dorie Rank, MD                           Medical Decision Making Amount and/or Complexity of Data Reviewed Labs: ordered. Decision-making details documented in ED Course. Radiology: ordered. Decision-making details documented in ED Course.   Patient presented to the ED for evaluation of palpitations.  ED work-up reassuring.  No signs of anemia.  No signs of dehydration.  Serial troponins normal doubt acute coronary syndrome.  Low risk for PE, D-dimer is negative.  Doubt pulmonary embolism.  Patient monitored in the ED and no signs of any cardiac dysrhythmia.    Evaluation and diagnostic testing in the emergency department does not suggest an emergent condition requiring admission or immediate intervention beyond what has been performed at this time.  The patient is safe for discharge and has been instructed to return immediately for worsening symptoms, change in symptoms or any other concerns.  Recommend outpatient follow-up with cardiology for possible outpatient cardiac monitoring for evaluation.        Final Clinical Impression(s) / ED Diagnoses Final diagnoses:  Heart palpitations    Rx / DC Orders ED Discharge Orders     None         Dorie Rank, MD 10/05/21 2319

## 2021-10-05 NOTE — ED Triage Notes (Signed)
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 Went to [facility] for eval of same on Tuesday without any abnormalities. Palpitations returned today. Denies chest pain, just states feels as if heart is racing.

## 2021-11-22 ENCOUNTER — Ambulatory Visit: Payer: BC Managed Care – PPO | Admitting: Cardiology

## 2021-11-22 ENCOUNTER — Other Ambulatory Visit: Payer: Self-pay

## 2021-11-22 ENCOUNTER — Encounter: Payer: Self-pay | Admitting: Cardiology

## 2021-11-22 VITALS — BP 145/89 | HR 75 | Temp 97.9°F | Resp 16 | Ht 69.0 in | Wt 178.0 lb

## 2021-11-22 DIAGNOSIS — I10 Essential (primary) hypertension: Secondary | ICD-10-CM

## 2021-11-22 DIAGNOSIS — N529 Male erectile dysfunction, unspecified: Secondary | ICD-10-CM

## 2021-11-22 DIAGNOSIS — R42 Dizziness and giddiness: Secondary | ICD-10-CM

## 2021-11-22 DIAGNOSIS — E7800 Pure hypercholesterolemia, unspecified: Secondary | ICD-10-CM

## 2021-11-22 DIAGNOSIS — R0989 Other specified symptoms and signs involving the circulatory and respiratory systems: Secondary | ICD-10-CM

## 2021-11-22 DIAGNOSIS — R002 Palpitations: Secondary | ICD-10-CM

## 2021-11-22 MED ORDER — OLMESARTAN MEDOXOMIL 40 MG PO TABS: 40.0000 mg | ORAL_TABLET | Freq: Every morning | ORAL | 0 refills | Status: DC

## 2021-11-22 MED ORDER — HYDROCHLOROTHIAZIDE 12.5 MG PO CAPS: 12.5000 mg | ORAL_CAPSULE | Freq: Every day | ORAL | 0 refills | Status: DC

## 2021-11-22 NOTE — Progress Notes (Signed)
? ?Date:  11/22/2021  ? ?ID:  Roberto Miranda, DOB Jun 27, 1961, MRN [id number] ? ?PCP:  Audley Hose, MD  ?Cardiologist:  Rex Kras, DO, [facility] (established care 11/22/2021) ? ?REASON FOR CONSULT: Palpitations ? ?REQUESTING PHYSICIAN:  ?Antony Contras, MD ?[address] ?[address] ?[place],  [place] [address] ? ?Chief Complaint  ?Patient presents with  ? Palpitations  ? Dizziness  ? New Patient (Initial Visit)  ? ? ?HPI  ?Roberto Miranda is a 61 y.o.  male who presents to the office with a chief complaint of "palpitations and lightheaded / dizziness." Patient's past medical history and cardiovascular risk factors include: Hypertension, erectile dysfunction, hyperlipidemia. ? ?He is referred to the office at the request of Antony Contras, MD for evaluation of palpitations, lightheaded, dizziness. ? ?Patient presents to the office for evaluation of palpitations/dizziness/lightheadedness.  He was recently seen in [facility] emergency room department in February 2023 as he was having recurrent episodes of palpitations associated with lightheaded and dizziness.  Patient states that when he overexerts himself he feels that his heart is racing and at times it is associated with lightheaded and dizziness.  He has not experienced syncope. ?Back in February 2023 may be an anxiety attack according to the patient. ? ?In the emergency room department has high sensitive troponins were negative x2, D-dimer is within normal limits, and EKG did not show concerns for ACS. ? ?Since that episode in February 2023 patient states that his symptoms have improved but not entirely resolved. ? ?Patient denies any history of anemia or thyroid disease.  He denies the consumption of excessive amounts of coffee/caffeinated beverages/soda.  Patient denies a consumption of illicit drugs/alcohol/over-the-counter medications/stimulants/energy drinks. ? ?His blood pressure are labile  - morning number are lower than evening.  ? ?FUNCTIONAL STATUS: ?He  exercises every day which includes both aerobic and resistance training. ? ?ALLERGIES: ?No Known Allergies ? ?MEDICATION LIST PRIOR TO VISIT: ?Current Meds  ?Medication Sig  ? Fluticasone Propionate (XHANCE) 93 MCG/ACT EXHU Place 2 puffs into both nostrils in the morning and at bedtime.  ? hydrochlorothiazide (MICROZIDE) 12.5 MG capsule Take 1 capsule (12.5 mg total) by mouth daily at 2 PM.  ? ibuprofen (ADVIL) 600 MG tablet Take 600 mg by mouth every 6 (six) hours as needed.  ? olmesartan (BENICAR) 40 MG tablet Take 1 tablet (40 mg total) by mouth every morning.  ? Turmeric (QC TUMERIC COMPLEX PO)   ? [DISCONTINUED] olmesartan-hydrochlorothiazide (BENICAR HCT) 40-12.5 MG tablet   ?  ? ?PAST MEDICAL HISTORY: ?Past Medical History:  ?Diagnosis Date  ? Hyperlipidemia   ? Hypertension   ? ? ?PAST SURGICAL HISTORY: ?Past Surgical History:  ?Procedure Laterality Date  ? BACK SURGERY    ? ? ?FAMILY HISTORY: ?The patient family history includes Allergic rhinitis in his brother and father; Cancer in his father; Diabetes in his mother. ? ?SOCIAL HISTORY:  ?The patient  reports that he has never smoked. He has never used smokeless tobacco. He reports that he does not drink alcohol and does not use drugs. ? ?REVIEW OF SYSTEMS: ?Review of Systems  ?Cardiovascular:  Positive for palpitations. Negative for chest pain, dyspnea on exertion, leg swelling, orthopnea, paroxysmal nocturnal dyspnea and syncope.  ?Respiratory:  Negative for shortness of breath.   ?Neurological:  Positive for dizziness and headaches.  ? ?PHYSICAL EXAM: ? ?  11/22/2021  ?  8:45 AM 10/05/2021  ? 10:30 PM 10/05/2021  ? 10:15 PM  ?Vitals with BMI  ?Height '5\' 9"'$     ?  Weight 178 lbs    ?BMI 26.27    ?Systolic 865 784   ?Diastolic 89 84   ?Pulse 75 72 76  ? ?Orthostatic VS for the past 72 hrs (Last 3 readings): ? Orthostatic BP Patient Position BP Location Cuff Size Orthostatic Pulse  ?11/22/21 0856 (!) 146/91 Standing Left Arm Normal 72  ?11/22/21 0855 141/83 Sitting  Left Arm Normal 68  ?11/22/21 0854 136/86 Supine Left Arm Normal 77  ? ?CONSTITUTIONAL: Well-developed and well-nourished. No acute distress.  ?SKIN: Skin is warm and dry. No rash noted. No cyanosis. No pallor. No jaundice ?HEAD: Normocephalic and atraumatic.  ?EYES: No scleral icterus.  Arcus senilis ?MOUTH/THROAT: Moist oral membranes.  ?NECK: No JVD present. No thyromegaly noted.  Faint bilateral carotid bruits  ?LYMPHATIC: No visible cervical adenopathy.  ?CHEST Normal respiratory effort. No intercostal retractions  ?LUNGS: Clear to auscultation bilaterally.  No stridor. No wheezes. No rales.  ?CARDIOVASCULAR: Regular rate and rhythm, positive S1-S2, no murmurs rubs or gallops appreciated. ?ABDOMINAL: Soft, nontender, nondistended, positive bowel sounds in all 4 quadrants, no apparent ascites.  ?EXTREMITIES: No peripheral edema, warm to touch, 2+ bilateral DP and PT pulses ?HEMATOLOGIC: No significant bruising ?NEUROLOGIC: Oriented to person, place, and time. Nonfocal. Normal muscle tone.  ?PSYCHIATRIC: Normal mood and affect. Normal behavior. Cooperative ? ?CARDIAC DATABASE: ?EKG: ?11/22/21: NSR, 66bpm, normal axis, without underlying injury pattern.  ? ?Echocardiogram: ?No results found for this or any previous visit from the past 1095 days. ?  ? ?Stress Testing: ?No results found for this or any previous visit from the past 1095 days. ? ? ?Heart Catheterization: ?None ? ?LABORATORY DATA: ? ?  Latest Ref Rng & Units 10/05/2021  ?  4:14 PM  ?CBC  ?WBC 4.0 - 10.5 K/uL 7.5    ?Hemoglobin 13.0 - 17.0 g/dL 14.8    ?Hematocrit 39.0 - 52.0 % 41.3    ?Platelets 150 - 400 K/uL 256    ? ? ? ?  Latest Ref Rng & Units 10/05/2021  ?  4:14 PM  ?CMP  ?Glucose 70 - 99 mg/dL 98    ?BUN 6 - 20 mg/dL 10    ?Creatinine 0.61 - 1.24 mg/dL 0.95    ?Sodium 135 - 145 mmol/L 140    ?Potassium 3.5 - 5.1 mmol/L 3.6    ?Chloride 98 - 111 mmol/L 103    ?CO2 22 - 32 mmol/L 26    ?Calcium 8.9 - 10.3 mg/dL 9.5    ? ? ?Lipid Panel  ?No results  found for: CHOL, TRIG, HDL, CHOLHDL, VLDL, LDLCALC, LDLDIRECT, LABVLDL ? ?No components found for: NTPROBNP ?No results for input(s): PROBNP in the last 8760 hours. ?No results for input(s): TSH in the last 8760 hours. ? ?BMP ?Recent Labs  ?  10/05/21 ?6962  ?NA 140  ?K 3.6  ?CL 103  ?CO2 26  ?GLUCOSE 98  ?BUN 10  ?CREATININE 0.95  ?CALCIUM 9.5  ?GFRNONAA >60  ? ? ?HEMOGLOBIN A1C ?No results found for: HGBA1C, MPG ? ?CBC with Diff   2020-11-15    ?WBC 6.3   4.0-11.0  ?RBC 4.73   4.20-5.80  ?HGB 14.5   13.0-17.0  ?HCT 43.8   39.0-52.0  ?MCV 92.5   80.0-94.0  ?MCH 30.5   27.0-33.0  ?MCHC 33.0   32.0-36.0  ?RDW 13.7   11.5-15.5  ?PLT 256   150-400  ?MPV 9.7   7.5-10.7  ?NE% 46.0   43.3-71.9  ?LY% 46.6   16.8-43.5  ?MO% 5.6  4.6-12.4  ?EO% 0.7   0.0-7.8  ?BA% 1.1   0.0-1.0  ?NE# 2.9   1.9-7.2  ?LY# 2.90   1.10-2.70  ?MO# 0.3   0.3-0.8  ?EO# 0.0   0.0-0.6  ?BA# 0.1   0.0-0.1  ?NRBC% 0.30      ?NRBC# 0.02      ?Comp Metabolic Panel   [id number]    ?Glucose 82   70-99  ?BUN 10   6-26  ?Creatinine 0.98   0.60-1.30  ?Sodium 142   136-145  ?Potassium 4.6   3.5-5.5  ?Chloride 100   98-107  ?CO2 34   22-32  ?Anion Gap 12.5   6.0-20.0  ?Calcium 10.5   8.6-10.3  ?CA-corrected 9.63   8.60-10.30  ?Protein, Total 7.3   6.0-8.3  ?Albumin 4.9   3.4-4.8  ?TBIL 1.6   0.3-1.0  ?ALP 53   38-126  ?AST 20   0-39  ?ALT 24   0-52  ?PSA   2020-11-15    ?PSA 0.69   0.01-4.00  ?TSH   2020-11-15    ?TSH 1.03   0.34-4.50  ?Lipid Panel w/reflex   2020-11-15    ?Cholesterol 228   <200  ?CHOL/HDL 4.9   2.0-4.0  ?HDLD 47   30-70  ?Triglyceride 151   0-199  ?NHDL 181   0-129  ?LDL Chol Calc (NIH) 154   0-99  ?LDL Chol Calc (NIH) 154   0-99  ? Calculated LDL '150mg'$ /dL ? ?IMPRESSION: ? ?  ICD-10-CM   ?1. Palpitations  R00.2 EKG 12-Lead  ?  LONG TERM MONITOR (3-14 DAYS)  ?  ?2. Lightheaded  R42   ?  ?3. Dizziness  R42   ?  ?4. Bilateral carotid bruits  R09.89 PCV CAROTID DUPLEX (BILATERAL)  ?  ?5. Pure hypercholesterolemia  E78.00 CT CARDIAC SCORING ([facility]  LOCATIONS ONLY)  ?  ?6. Erectile dysfunction, unspecified erectile dysfunction type  N52.9   ?  ?7. Benign hypertension  I10 olmesartan (BENICAR) 40 MG tablet  ?  hydrochlorothiazide (MICROZIDE) 12.5 MG

## 2021-11-27 ENCOUNTER — Ambulatory Visit
Admission: RE | Admit: 2021-11-27 | Discharge: 2021-11-27 | Disposition: A | Discharge status: Home / Self Care | Payer: No Typology Code available for payment source | Source: Ambulatory Visit | Attending: Cardiology | Admitting: Cardiology

## 2021-11-27 DIAGNOSIS — E7800 Pure hypercholesterolemia, unspecified: Secondary | ICD-10-CM

## 2021-11-28 ENCOUNTER — Telehealth: Payer: Self-pay

## 2021-11-28 NOTE — Telephone Encounter (Signed)
STAT Report: called in by Valarie Merino in Radiology ? ?Expression 1: Coronary calcium score of 55.7, is at the 26 th percentile for the patient age, sex and race ? ?Expression 2: Moderate hiatal hernia with irregular wall thickening of the herniated stomach possibly also involving the GE junction component of inflammation or neoplasm cannot be excluded. EGD may be of benefit and direct examination evidence of multiple ill defined liver lesions and recommending a full contrast enhanced CT of the abdomen and pelvis to access for liver lesions and possible source. ?

## 2021-11-29 ENCOUNTER — Ambulatory Visit: Payer: BC Managed Care – PPO

## 2021-11-29 ENCOUNTER — Inpatient Hospital Stay: Payer: BC Managed Care – PPO

## 2021-11-29 DIAGNOSIS — R002 Palpitations: Secondary | ICD-10-CM

## 2021-11-29 DIAGNOSIS — I10 Essential (primary) hypertension: Secondary | ICD-10-CM

## 2021-11-29 DIAGNOSIS — R0989 Other specified symptoms and signs involving the circulatory and respiratory systems: Secondary | ICD-10-CM

## 2021-12-05 NOTE — Telephone Encounter (Signed)
Patient is made aware of the results of his coronary calcium score.  Spoke to him with phone on 12/01/2021. ? ?Patient is informed that the noncardiac findings are concerning for possible inflammation/neoplasm (i.e. cancer) involving the stomach.  And also noted to have multiple ill-defined liver lesions.  He needs to be evaluated by gastroenterology.  I have asked him to reach out to his PCP and request GI consultation. ? ?Patient verbalizes understanding. ? ?Cecilia, please send a copy of the coronary calcium score to his PCP Dr. Maia Petties for continuity of care..  ? ?Rex Kras, DO, FACC ?Pager: [phone] ?Office: [phone] ?

## 2021-12-06 NOTE — Progress Notes (Signed)
Pt called back. Pt is aware.

## 2021-12-06 NOTE — Progress Notes (Signed)
Called pt no answer, left a vm to call back

## 2021-12-07 NOTE — Progress Notes (Signed)
External Labs: ?Collected: 11/24/2021 ?Total cholesterol 177, triglycerides 86, HDL 44, LDL 114, non-HDL 133. ?BUN 11, creatinine 0.95. ?eGFR 92. ?Sodium 143, potassium 4.1, chloride 105, bicarb 29 ?AST 17, ALT 18, alkaline phosphatase 53 (all within normal limits) total bilirubin 1.6 (above normal limits) ?A1c 4.7 ?Hemoglobin 13.6, hematocrit 40.9. ? ?Please inform patient that his kidney function and electrolytes are within acceptable range after medication changes. ? ?Total bilirubin is slightly above normal limits.  Please have him follow-up with his PCP to see if any additional changes or work-up is warranted ? ?I spoke to him last week regarding his noncardiac findings on a recent coronary calcium score.  He was instructed to follow-up with PCP please ask if he has done so or not. ? ?Bridey Brookover Terri Skains, DO, [facility]

## 2021-12-08 ENCOUNTER — Other Ambulatory Visit: Payer: Self-pay | Admitting: Family Medicine

## 2021-12-08 DIAGNOSIS — K769 Liver disease, unspecified: Secondary | ICD-10-CM

## 2021-12-11 NOTE — Progress Notes (Signed)
Called and spoke to pt, pt voiced understanding and stated he does not remember if he spoke to his PCP or not. Informed that pt that he needs to discuss his bilirubin levels and coronary calcium score with his PCP.

## 2021-12-14 ENCOUNTER — Other Ambulatory Visit: Payer: Self-pay | Admitting: Cardiology

## 2021-12-14 DIAGNOSIS — I10 Essential (primary) hypertension: Secondary | ICD-10-CM

## 2021-12-15 ENCOUNTER — Ambulatory Visit
Admission: RE | Admit: 2021-12-15 | Discharge: 2021-12-15 | Disposition: A | Discharge status: Home / Self Care | Payer: BC Managed Care – PPO | Source: Ambulatory Visit | Attending: Family Medicine | Admitting: Family Medicine

## 2021-12-15 DIAGNOSIS — K769 Liver disease, unspecified: Secondary | ICD-10-CM

## 2021-12-15 MED ORDER — IOPAMIDOL (ISOVUE-300) INJECTION 61%
100.0000 mL | Freq: Once | INTRAVENOUS | Status: AC | PRN
  Administered 2021-12-15: 100 mL via INTRAVENOUS

## 2021-12-26 ENCOUNTER — Encounter: Payer: Self-pay | Admitting: Cardiology

## 2021-12-26 ENCOUNTER — Ambulatory Visit: Payer: BC Managed Care – PPO | Admitting: Cardiology

## 2021-12-26 VITALS — BP 145/92 | HR 86 | Temp 98.6°F | Resp 16 | Ht 69.0 in | Wt 178.8 lb

## 2021-12-26 DIAGNOSIS — E7800 Pure hypercholesterolemia, unspecified: Secondary | ICD-10-CM

## 2021-12-26 DIAGNOSIS — R002 Palpitations: Secondary | ICD-10-CM

## 2021-12-26 DIAGNOSIS — I2510 Atherosclerotic heart disease of native coronary artery without angina pectoris: Secondary | ICD-10-CM

## 2021-12-26 DIAGNOSIS — R42 Dizziness and giddiness: Secondary | ICD-10-CM

## 2021-12-26 DIAGNOSIS — R931 Abnormal findings on diagnostic imaging of heart and coronary circulation: Secondary | ICD-10-CM

## 2021-12-26 NOTE — Progress Notes (Signed)
? ?Date:  12/26/2021  ? ?ID:  Roberto Miranda, DOB 03-24-1961, MRN [id number] ? ?PCP:  Audley Hose, MD  ?Cardiologist:  Rex Kras, DO, [facility] (established care 11/22/2021) ? ?Date: 12/26/21 ?Last Office Visit: 11/22/2021 ? ?Chief Complaint  ?Patient presents with  ? Palpitations  ? Results  ? Follow-up  ?  4 weeks  ? ? ?HPI  ?Roberto Miranda is a 61 y.o.  male whose past medical history and cardiovascular risk factors include: Mild coronary artery calcification (total CAC 55.7, 78th percentile), hypertension, erectile dysfunction, hyperlipidemia. ? ?Prior to establishing care patient was having frequent episodes of palpitations, lightheaded, dizziness for which she had gone to [facility] ED for evaluation and management.  He is high sensitive troponins are negative x2 and D-dimer is within normal limits and EKG not concerning for ACS.  He was referred to cardiology for further evaluation and management. ? ?No identifiable reversible causes and noted at the last office visit.  The shared decision was to proceed with an extended Holter monitor which notes the underlying rhythm to be sinus.  No significant dysrhythmias.  Given his lightheaded and dizziness I had separated his blood pressure pills so that he is taking 1 in the morning and 1 in the evening.  The change has significantly improved his lightheaded and dizziness.  He no longer is experiencing palpitations. ? ?Given his uncontrolled hyperlipidemia shared decision was to proceed with coronary calcium score for further risk stratification.  He is noted to have mild CAC placing him greater than 70th percentile for his matched cohorts.  Shared decision was to start aspirin and cholesterol medication.  He was also noted to have noncardiac findings which were discussed with him over the phone in the interim and has had additional imaging studies and plans to follow-up with GI as well. ? ?FUNCTIONAL STATUS: ?He exercises every day which includes both aerobic and  resistance training. ? ?ALLERGIES: ?No Known Allergies ? ?MEDICATION LIST PRIOR TO VISIT: ?Current Meds  ?Medication Sig  ? aspirin EC 81 MG tablet Take 81 mg by mouth daily. Swallow whole.  ? Fluticasone Propionate (XHANCE) 93 MCG/ACT EXHU Place 2 puffs into both nostrils in the morning and at bedtime.  ? hydrochlorothiazide (MICROZIDE) 12.5 MG capsule TAKE 1 CAPSULE (12.5 MG TOTAL) BY MOUTH DAILY AT 2 PM.  ? olmesartan (BENICAR) 40 MG tablet TAKE 1 TABLET BY MOUTH EVERY DAY IN THE MORNING  ? rosuvastatin (CRESTOR) 5 MG tablet Take 5 mg by mouth daily.  ? Turmeric (QC TUMERIC COMPLEX PO)   ?  ? ?PAST MEDICAL HISTORY: ?Past Medical History:  ?Diagnosis Date  ? Agatston CAC score, <100   ? Coronary atherosclerosis due to calcified coronary lesion   ? Hyperlipidemia   ? Hypertension   ? ? ?PAST SURGICAL HISTORY: ?Past Surgical History:  ?Procedure Laterality Date  ? BACK SURGERY    ? ? ?FAMILY HISTORY: ?The patient family history includes Allergic rhinitis in his brother and father; Cancer in his father; Diabetes in his mother. ? ?SOCIAL HISTORY:  ?The patient  reports that he has never smoked. He has never used smokeless tobacco. He reports that he does not drink alcohol and does not use drugs. ? ?REVIEW OF SYSTEMS: ?Review of Systems  ?Cardiovascular:  Negative for chest pain, dyspnea on exertion, leg swelling, orthopnea, palpitations, paroxysmal nocturnal dyspnea and syncope.  ?Respiratory:  Negative for shortness of breath.   ?Neurological:  Positive for dizziness (Improving) and headaches (Improving).  ? ?PHYSICAL EXAM: ? ?  12/26/2021  ?  4:00 PM 11/22/2021  ?  8:45 AM 10/05/2021  ? 10:30 PM  ?Vitals with BMI  ?Height '5\' 9"'$  '5\' 9"'$    ?Weight 178 lbs 13 oz 178 lbs   ?BMI 26.39 26.27   ?Systolic 706 237 628  ?Diastolic 92 89 84  ?Pulse 86 75 72  ? ? ?CONSTITUTIONAL: Well-developed and well-nourished. No acute distress.  ?SKIN: Skin is warm and dry. No rash noted. No cyanosis. No pallor. No jaundice ?HEAD: Normocephalic  and atraumatic.  ?EYES: No scleral icterus.  Arcus senilis ?MOUTH/THROAT: Moist oral membranes.  ?NECK: No JVD present. No thyromegaly noted.  ?LYMPHATIC: No visible cervical adenopathy.  ?CHEST Normal respiratory effort. No intercostal retractions  ?LUNGS: Clear to auscultation bilaterally.  No stridor. No wheezes. No rales.  ?CARDIOVASCULAR: Regular rate and rhythm, positive S1-S2, no murmurs rubs or gallops appreciated. ?ABDOMINAL: Soft, nontender, nondistended, positive bowel sounds in all 4 quadrants, no apparent ascites.  ?EXTREMITIES: No peripheral edema, warm to touch, 2+ bilateral DP and PT pulses ?HEMATOLOGIC: No significant bruising ?NEUROLOGIC: Oriented to person, place, and time. Nonfocal. Normal muscle tone.  ?PSYCHIATRIC: Normal mood and affect. Normal behavior. Cooperative ? ?CARDIAC DATABASE: ?EKG: ?11/22/21: NSR, 66bpm, normal axis, without underlying injury pattern.  ? ?Echocardiogram: ?11/29/2021:  ?Normal LV systolic function with visual EF 60-65%. Left ventricle cavity is normal in size. Normal left ventricular wall thickness. Normal global wall motion. Normal diastolic filling pattern, normal LAP.  ?Mild (Grade I) mitral regurgitation.  ?No prior study for comparison. ?  ? ?Stress Testing: ?No results found for this or any previous visit from the past 1095 days. ? ? ?Heart Catheterization: ?None ? ?Calcium Score Report  ?11/27/2021: ?1. Coronary calcium score of 55.7 is at the [place] percentile for the patient's age, sex and race.  ?2. Moderate hiatal hernia with irregular wall thickening of the herniated stomach possibly also involving the GE junction. Component of inflammation or neoplasm cannot be excluded. Correlation suggested with any symptoms. EGD may be of benefit in direct examination. ?3. Evidence of multiple ill-defined liver lesions by unenhanced CT. See measurements above. Recommend a full contrast enhanced CT of the abdomen and pelvis to assess for liver lesions and possible  source. ? ?Carotid artery duplex 11/29/2021:  ?Duplex suggests stenosis in the right external carotid artery (<50%).  ?Duplex suggests stenosis in the left external carotid artery (<50%).  ?There is mild homogeneous plaque noted in the carotid arteries. External carotid stenosis source of bruit.  ?Antegrade right vertebral artery flow. Antegrade left vertebral artery flow.  ?Follow up in one year is appropriate if clinically indicated. ? ?14 day extended Holter monitor: ?Dominant rhythm normal sinus rhythm. ?Heart rate 47-187 bpm.  Avg HR 79 bpm. ?No atrial fibrillation, ventricular tachycardia, high grade AV block, pauses (3 seconds or longer). ?Total ventricular ectopic burden <1%. ?Rare episodes of supraventricular tachycardia. ?Total supraventricular ectopic burden <1%. ?Patient triggered events: 2.  These did not correlate with any significant dysrhythmias. ? ?LABORATORY DATA: ? ?  Latest Ref Rng & Units 10/05/2021  ?  4:14 PM  ?CBC  ?WBC 4.0 - 10.5 K/uL 7.5    ?Hemoglobin 13.0 - 17.0 g/dL 14.8    ?Hematocrit 39.0 - 52.0 % 41.3    ?Platelets 150 - 400 K/uL 256    ? ? ? ?  Latest Ref Rng & Units 10/05/2021  ?  4:14 PM  ?CMP  ?Glucose 70 - 99 mg/dL 98    ?BUN 6 - 20 mg/dL 10    ?  Creatinine 0.61 - 1.24 mg/dL 0.95    ?Sodium 135 - 145 mmol/L 140    ?Potassium 3.5 - 5.1 mmol/L 3.6    ?Chloride 98 - 111 mmol/L 103    ?CO2 22 - 32 mmol/L 26    ?Calcium 8.9 - 10.3 mg/dL 9.5    ? ? ?Lipid Panel  ?No results found for: CHOL, TRIG, HDL, CHOLHDL, VLDL, LDLCALC, LDLDIRECT, LABVLDL ? ?No components found for: NTPROBNP ?No results for input(s): PROBNP in the last 8760 hours. ?No results for input(s): TSH in the last 8760 hours. ? ?BMP ?Recent Labs  ?  10/05/21 ?3614  ?NA 140  ?K 3.6  ?CL 103  ?CO2 26  ?GLUCOSE 98  ?BUN 10  ?CREATININE 0.95  ?CALCIUM 9.5  ?GFRNONAA >60  ? ? ?HEMOGLOBIN A1C ?No results found for: HGBA1C, MPG ? ?CBC with Diff   2020-11-15    ?WBC 6.3   4.0-11.0  ?RBC 4.73   4.20-5.80  ?HGB 14.5   13.0-17.0  ?HCT  43.8   39.0-52.0  ?MCV 92.5   80.0-94.0  ?MCH 30.5   27.0-33.0  ?MCHC 33.0   32.0-36.0  ?RDW 13.7   11.5-15.5  ?PLT 256   150-400  ?MPV 9.7   7.5-10.7  ?NE% 46.0   43.3-71.9  ?LY% 46.6   16.8-43.5  ?MO% 5.6   4

## 2022-01-08 ENCOUNTER — Ambulatory Visit: Payer: BC Managed Care – PPO

## 2022-01-08 DIAGNOSIS — E7800 Pure hypercholesterolemia, unspecified: Secondary | ICD-10-CM

## 2022-01-08 DIAGNOSIS — R931 Abnormal findings on diagnostic imaging of heart and coronary circulation: Secondary | ICD-10-CM

## 2022-01-08 DIAGNOSIS — I2510 Atherosclerotic heart disease of native coronary artery without angina pectoris: Secondary | ICD-10-CM

## 2022-01-13 ENCOUNTER — Other Ambulatory Visit: Payer: Self-pay | Admitting: Cardiology

## 2022-01-13 DIAGNOSIS — I10 Essential (primary) hypertension: Secondary | ICD-10-CM

## 2022-02-15 ENCOUNTER — Ambulatory Visit: Payer: BC Managed Care – PPO | Admitting: Cardiology

## 2022-03-01 ENCOUNTER — Encounter: Payer: Self-pay | Admitting: Cardiology

## 2022-03-01 ENCOUNTER — Ambulatory Visit: Payer: BC Managed Care – PPO | Admitting: Cardiology

## 2022-03-01 VITALS — BP 151/91 | HR 71 | Temp 98.3°F | Resp 16 | Ht 69.0 in | Wt 177.0 lb

## 2022-03-01 DIAGNOSIS — R931 Abnormal findings on diagnostic imaging of heart and coronary circulation: Secondary | ICD-10-CM

## 2022-03-01 DIAGNOSIS — I2510 Atherosclerotic heart disease of native coronary artery without angina pectoris: Secondary | ICD-10-CM

## 2022-03-01 DIAGNOSIS — E7800 Pure hypercholesterolemia, unspecified: Secondary | ICD-10-CM

## 2022-03-01 NOTE — Progress Notes (Signed)
Date:  03/01/2022   ID:  Roberto Miranda, DOB November 28, 1960, MRN [id number]  PCP:  Audley Hose, MD  Cardiologist:  Rex Kras, DO, [facility] (established care 11/22/2021)  Date: 03/01/22 Last Office Visit: 12/26/2021  Chief Complaint  Patient presents with   Coronary Artherosclerosis   Follow-up    HPI  Roberto Miranda is a 61 y.o.  male whose past medical history and cardiovascular risk factors include: Mild coronary artery calcification (total CAC 55.7, 78th percentile), hypertension, erectile dysfunction, hyperlipidemia.  Patient has a known history of uncontrolled hyperlipidemia and was hesitant to be on statin therapy.  He underwent a coronary artery calcium score which noted a total CAC of 55.7 AU placing him at the 70th percentile.  At the last office visit he was prescribed low-dose Crestor as a shared decision was to start medical therapy and repeat labs in 6 weeks.  Since last office visit patient states that he has restart his decision and would like to hold off on medical therapy.  He has been implementing lifestyle changes and had a repeat lipid profile which notes improvement in LDL.  Patient would like to continue the same as long as the LDL levels continue to improve.  He denies anginal discomfort or heart failure symptoms.  His coronary calcium score also noted multiple noncardiac findings predominantly GI related.  Patient states that he is followed up with gastroenterology and underwent a CT of the abdomen with contrast results reviewed.  FUNCTIONAL STATUS: He exercises every day which includes both aerobic and resistance training.  ALLERGIES: No Known Allergies  MEDICATION LIST PRIOR TO VISIT: Current Meds  Medication Sig   Fluticasone Propionate (XHANCE) 93 MCG/ACT EXHU Place 2 puffs into both nostrils in the morning and at bedtime.   hydrochlorothiazide (MICROZIDE) 12.5 MG capsule TAKE 1 CAPSULE (12.5 MG TOTAL) BY MOUTH DAILY AT 2 PM.   ibuprofen (ADVIL) 600  MG tablet Take 600 mg by mouth every 6 (six) hours as needed.   olmesartan (BENICAR) 40 MG tablet TAKE 1 TABLET BY MOUTH EVERY DAY IN THE MORNING   Turmeric (QC TUMERIC COMPLEX PO)      PAST MEDICAL HISTORY: Past Medical History:  Diagnosis Date   Agatston CAC score, <100    Coronary atherosclerosis due to calcified coronary lesion    Hyperlipidemia    Hypertension     PAST SURGICAL HISTORY: Past Surgical History:  Procedure Laterality Date   BACK SURGERY      FAMILY HISTORY: The patient family history includes Allergic rhinitis in his brother and father; Cancer in his father; Diabetes in his mother.  SOCIAL HISTORY:  The patient  reports that he has never smoked. He has never used smokeless tobacco. He reports that he does not drink alcohol and does not use drugs.  REVIEW OF SYSTEMS: Review of Systems  Cardiovascular:  Negative for chest pain, dyspnea on exertion, leg swelling, orthopnea, palpitations, paroxysmal nocturnal dyspnea and syncope.  Respiratory:  Negative for shortness of breath.   Neurological:  Positive for dizziness (Improving) and headaches (Improving).    PHYSICAL EXAM:    03/01/2022    3:19 PM 03/01/2022    3:14 PM 12/26/2021    4:00 PM  Vitals with BMI  Height  _0  _1   Weight  177 lbs 178 lbs 13 oz  BMI  08.67 61.95  Systolic 093 267 124  Diastolic 91 88 92  Pulse 71 75 86    CONSTITUTIONAL: Well-developed and well-nourished. No acute distress.  SKIN: Skin is warm and dry. No rash noted. No cyanosis. No pallor. No jaundice HEAD: Normocephalic and atraumatic.  EYES: No scleral icterus.  Arcus senilis MOUTH/THROAT: Moist oral membranes.  NECK: No JVD present. No thyromegaly noted.  LYMPHATIC: No visible cervical adenopathy.  CHEST Normal respiratory effort. No intercostal retractions  LUNGS: Clear to auscultation bilaterally.  No stridor. No wheezes. No rales.  CARDIOVASCULAR: Regular rate and rhythm, positive S1-S2, no murmurs rubs or  gallops appreciated. ABDOMINAL: Soft, nontender, nondistended, positive bowel sounds in all 4 quadrants, no apparent ascites.  EXTREMITIES: No peripheral edema, warm to touch, 2+ bilateral DP and PT pulses HEMATOLOGIC: No significant bruising NEUROLOGIC: Oriented to person, place, and time. Nonfocal. Normal muscle tone.  PSYCHIATRIC: Normal mood and affect. Normal behavior. Cooperative  CARDIAC DATABASE: EKG: 11/22/21: NSR, 66bpm, normal axis, without underlying injury pattern.   Echocardiogram: 11/29/2021:  Normal LV systolic function with visual EF 60-65%. Left ventricle cavity is normal in size. Normal left ventricular wall thickness. Normal global wall motion. Normal diastolic filling pattern, normal LAP.  Mild (Grade I) mitral regurgitation.  No prior study for comparison.    Stress Testing: Exercise Sestamibi stress test 01/08/2022: Exercise nuclear stress test was performed using Bruce protocol.   1 Day Rest and Stress images. Exercise time 6 minutes 43 seconds, achieved 8.14 METS, 99%APMHR.  Stress ECG negative for ischemia. Normal myocardial perfusion without convincing evidence of reversible myocardial ischemia or prior infarct. Left ventricular size is normal and wall thickness preserved without regional wall motion abnormalities. Calculated LVEF 81%. Low risk study.  Heart Catheterization: None  Calcium Score Report  11/27/2021: 1. Coronary calcium score of 55.7 is at the 78th percentile for the patient's age, sex and race.  2. Moderate hiatal hernia with irregular wall thickening of the herniated stomach possibly also involving the GE junction. Component of inflammation or neoplasm cannot be excluded. Correlation suggested with any symptoms. EGD may be of benefit in direct examination. 3. Evidence of multiple ill-defined liver lesions by unenhanced CT. See measurements above. Recommend a full contrast enhanced CT of the abdomen and pelvis to assess for liver lesions and  possible source.  Carotid artery duplex 11/29/2021:  Duplex suggests stenosis in the right external carotid artery (<50%).  Duplex suggests stenosis in the left external carotid artery (<50%).  There is mild homogeneous plaque noted in the carotid arteries. External carotid stenosis source of bruit.  Antegrade right vertebral artery flow. Antegrade left vertebral artery flow.  Follow up in one year is appropriate if clinically indicated.  14 day extended Holter monitor: Dominant rhythm normal sinus rhythm. Heart rate 47-187 bpm.  Avg HR 79 bpm. No atrial fibrillation, ventricular tachycardia, high grade AV block, pauses (3 seconds or longer). Total ventricular ectopic burden <1%. Rare episodes of supraventricular tachycardia. Total supraventricular ectopic burden <1%. Patient triggered events: 2.  These did not correlate with any significant dysrhythmias.  LABORATORY DATA:    Latest Ref Rng & Units 10/05/2021    4:14 PM  CBC  WBC 4.0 - 10.5 K/uL 7.5   Hemoglobin 13.0 - 17.0 g/dL 14.8   Hematocrit 39.0 - 52.0 % 41.3   Platelets 150 - 400 K/uL 256        Latest Ref Rng & Units 10/05/2021    4:14 PM  CMP  Glucose 70 - 99 mg/dL 98   BUN 6 - 20 mg/dL 10   Creatinine 0.61 - 1.24 mg/dL 0.95   Sodium 135 - 145 mmol/L 140  Potassium 3.5 - 5.1 mmol/L 3.6   Chloride 98 - 111 mmol/L 103   CO2 22 - 32 mmol/L 26   Calcium 8.9 - 10.3 mg/dL 9.5     Lipid Panel  No results found for: "CHOL", "TRIG", "HDL", "CHOLHDL", "VLDL", "LDLCALC", "LDLDIRECT", "LABVLDL"  No components found for: "NTPROBNP" No results for input(s): "PROBNP" in the last 8760 hours. No results for input(s): "TSH" in the last 8760 hours.  BMP Recent Labs    10/05/21 1614  NA 140  K 3.6  CL 103  CO2 26  GLUCOSE 98  BUN 10  CREATININE 0.95  CALCIUM 9.5  GFRNONAA >60    HEMOGLOBIN A1C No results found for: "HGBA1C", "MPG"  CBC with Diff   2020-11-15    WBC 6.3   4.0-11.0  RBC 4.73   4.20-5.80  HGB  14.5   13.0-17.0  HCT 43.8   39.0-52.0  MCV 92.5   80.0-94.0  MCH 30.5   27.0-33.0  MCHC 33.0   32.0-36.0  RDW 13.7   11.5-15.5  PLT 256   150-400  MPV 9.7   7.5-10.7  NE% 46.0   43.3-71.9  LY% 46.6   16.8-43.5  MO% 5.6   4.6-12.4  EO% 0.7   0.0-7.8  BA% 1.1   0.0-1.0  NE# 2.9   1.9-7.2  LY# 2.90   1.10-2.70  MO# 0.3   0.3-0.8  EO# 0.0   0.0-0.6  BA# 0.1   0.0-0.1  NRBC% 0.30      NRBC# 0.02      Comp Metabolic Panel   2563-89-37    Glucose 82   70-99  BUN 10   6-26  Creatinine 0.98   0.60-1.30  Sodium 142   136-145  Potassium 4.6   3.5-5.5  Chloride 100   98-107  CO2 34   22-32  Anion Gap 12.5   6.0-20.0  Calcium 10.5   8.6-10.3  CA-corrected 9.63   8.60-10.30  Protein, Total 7.3   6.0-8.3  Albumin 4.9   3.4-4.8  TBIL 1.6   0.3-1.0  ALP 53   38-126  AST 20   0-39  ALT 24   0-52  PSA   2020-11-15    PSA 0.69   0.01-4.00  TSH   2020-11-15    TSH 1.03   0.34-4.50  Lipid Panel w/reflex   2020-11-15    Cholesterol 228   <200  CHOL/HDL 4.9   2.0-4.0  HDLD 47   30-70  Triglyceride 151   0-199  NHDL 181   0-129  LDL Chol Calc (NIH) 154   0-99  LDL Chol Calc (NIH) 154   0-99   Calculated LDL 188m/dL  External Labs: Collected: 11/24/2021 [facility] provided by the patient Total cholesterol 177, triglycerides 86, HDL 44, LDL 114, non-HDL 133. BUN 11, creatinine 0.95. Sodium 143, potassium 4.1, chloride 105, bicarb 29. AST 17, ALT 18, alkaline phosphatase 53 Hemoglobin 13.6 g/dL, hematocrit 40.9%. TSH 1.02  Hemoglobin A1c 4.7   IMPRESSION:    ICD-10-CM   1. Coronary atherosclerosis due to calcified coronary lesion  I25.10 CMP14+EGFR   I25.84 LDL cholesterol, direct    Lipid Panel With LDL/HDL Ratio    2. Agatston coronary artery calcium score less than 100  R93.1 CMP14+EGFR    LDL cholesterol, direct    Lipid Panel With LDL/HDL Ratio    3. Pure hypercholesterolemia  E78.00 CMP14+EGFR    LDL cholesterol, direct    Lipid Panel With LDL/HDL Ratio  RECOMMENDATIONS: Welton Bord is a 61 y.o.  male whose past medical history and cardiac risk factors include: Hypertension, erectile dysfunction, hyperlipidemia.  Coronary atherosclerosis due to calcified coronary lesion Total CAC 55.7, 78th percentile. For now patient chooses not to be on aspirin and statin therapy. He will continue to implement lifestyle changes to control his LDL levels. We will recheck fasting lipid profile in 6 weeks and discussed the findings with him over the phone. Results of the nuclear stress was independently reviewed with the patient at today's visit. No additional cardiovascular testing warranted at this time.  Pure hypercholesterolemia Given his LDL of 150 mg/dL and mild CAC placing him at 78 percentile recommended aspirin and statin therapy. Since last office visit he is very reluctant to fill his Crestor as he is afraid that he will be on a lot of medications in the future. He is implementing lifestyle changes and had repeat lipids which noted improvement in LDL. He would like to continue with lifestyle modifications for now.  FINAL MEDICATION LIST END OF ENCOUNTER: No orders of the defined types were placed in this encounter.   Medications Discontinued During This Encounter  Medication Reason   rosuvastatin (CRESTOR) 5 MG tablet    aspirin EC 81 MG tablet       Current Outpatient Medications:    Fluticasone Propionate (XHANCE) 93 MCG/ACT EXHU, Place 2 puffs into both nostrils in the morning and at bedtime., Disp: 16 mL, Rfl: 12   hydrochlorothiazide (MICROZIDE) 12.5 MG capsule, TAKE 1 CAPSULE (12.5 MG TOTAL) BY MOUTH DAILY AT 2 PM., Disp: 90 capsule, Rfl: 1   ibuprofen (ADVIL) 600 MG tablet, Take 600 mg by mouth every 6 (six) hours as needed., Disp: , Rfl:    olmesartan (BENICAR) 40 MG tablet, TAKE 1 TABLET BY MOUTH EVERY DAY IN THE MORNING, Disp: 90 tablet, Rfl: 1   Turmeric (QC TUMERIC COMPLEX PO), , Disp: , Rfl:   No orders of the defined  types were placed in this encounter.   There are no Patient Instructions on file for this visit.   --Continue cardiac medications as reconciled in final medication list. --Return in about 1 year (around 03/02/2023) for Follow up, Coronary artery calcification. Or sooner if needed. --Continue follow-up with your primary care physician regarding the management of your other chronic comorbid conditions.  Patient's questions and concerns were addressed to his satisfaction. He voices understanding of the instructions provided during this encounter.   This note was created using a voice recognition software as a result there may be grammatical errors inadvertently enclosed that do not reflect the nature of this encounter. Every attempt is made to correct such errors.  Rex Kras, [place], [facility]  Pager: [phone] Office: [phone]

## 2022-07-26 ENCOUNTER — Other Ambulatory Visit: Payer: Self-pay | Admitting: Cardiology

## 2022-07-26 DIAGNOSIS — I10 Essential (primary) hypertension: Secondary | ICD-10-CM

## 2023-01-24 ENCOUNTER — Other Ambulatory Visit: Payer: Self-pay | Admitting: Internal Medicine

## 2023-01-24 DIAGNOSIS — M4712 Other spondylosis with myelopathy, cervical region: Secondary | ICD-10-CM

## 2023-02-17 ENCOUNTER — Other Ambulatory Visit: Payer: Self-pay | Admitting: Cardiology

## 2023-02-17 DIAGNOSIS — I10 Essential (primary) hypertension: Secondary | ICD-10-CM

## 2023-02-25 ENCOUNTER — Ambulatory Visit
Admission: RE | Admit: 2023-02-25 | Discharge: 2023-02-25 | Disposition: A | Discharge status: Home / Self Care | Payer: BC Managed Care – PPO | Source: Ambulatory Visit | Attending: Internal Medicine | Admitting: Internal Medicine

## 2023-02-25 DIAGNOSIS — M4712 Other spondylosis with myelopathy, cervical region: Secondary | ICD-10-CM

## 2023-03-04 ENCOUNTER — Encounter: Payer: Self-pay | Admitting: Cardiology

## 2023-03-04 ENCOUNTER — Ambulatory Visit: Payer: BC Managed Care – PPO | Admitting: Cardiology

## 2023-03-04 VITALS — BP 134/88 | HR 72 | Ht 69.0 in | Wt 183.4 lb

## 2023-03-04 DIAGNOSIS — R931 Abnormal findings on diagnostic imaging of heart and coronary circulation: Secondary | ICD-10-CM

## 2023-03-04 DIAGNOSIS — I2510 Atherosclerotic heart disease of native coronary artery without angina pectoris: Secondary | ICD-10-CM

## 2023-03-04 DIAGNOSIS — E7800 Pure hypercholesterolemia, unspecified: Secondary | ICD-10-CM

## 2023-03-04 NOTE — Progress Notes (Signed)
Date:  03/04/2023   ID:  Roberto Miranda, DOB 04/05/1961, MRN [id number]  PCP:  Harvest [place], MD  Cardiologist:  Tessa Lerner, DO, [facility] (established care 11/22/2021)  Date: 03/04/23 Last Office Visit: 02/09/2022  Chief Complaint  Patient presents with   Coronary atherosclerosis due to calcified coronary lesion   Follow-up    HPI  Roberto Miranda is a 62 y.o.  male whose past medical history and cardiovascular risk factors include: Mild coronary artery calcification (total CAC 55.7, 78th percentile), hypertension, erectile dysfunction, hyperlipidemia.  Patient has a known history of uncontrolled hyperlipidemia and was hesitant to be on statin therapy.  He underwent a coronary artery calcium score which noted a total CAC of 55.7 AU placing him at the 78th percentile.    During prior office visits he was recommended to be on aspirin 81 mg p.o. daily as well as statin therapy for secondary prevention.  However, patient was reluctant and wanted to focus on lifestyle changes.  Since then he has been implementing plant-based diet under the guidance of his sister who works for [employer] and his most recent lipids from December 2023 noted improvement in LDL levels.  Clinically denies anginal chest pain or heart failure symptoms.  His overall functional capacity remains relatively stable.  ALLERGIES: No Known Allergies  MEDICATION LIST PRIOR TO VISIT: Current Meds  Medication Sig   Fluticasone Propionate (XHANCE) 93 MCG/ACT EXHU Place 2 puffs into both nostrils in the morning and at bedtime.   hydrochlorothiazide (MICROZIDE) 12.5 MG capsule TAKE 1 CAPSULE (12.5 MG TOTAL) BY MOUTH DAILY AT 2 PM.   ibuprofen (ADVIL) 600 MG tablet Take 600 mg by mouth every 6 (six) hours as needed.   olmesartan (BENICAR) 40 MG tablet TAKE 1 TABLET BY MOUTH EVERY DAY IN THE MORNING   Turmeric (QC TUMERIC COMPLEX PO)      PAST MEDICAL HISTORY: Past Medical History:  Diagnosis Date   Agatston CAC score,  <100    Coronary atherosclerosis due to calcified coronary lesion    Hyperlipidemia    Hypertension     PAST SURGICAL HISTORY: Past Surgical History:  Procedure Laterality Date   BACK SURGERY      FAMILY HISTORY: The patient family history includes Allergic rhinitis in his brother and father; Cancer in his father; Diabetes in his mother.  SOCIAL HISTORY:  The patient  reports that he has never smoked. He has never used smokeless tobacco. He reports that he does not drink alcohol and does not use drugs.  REVIEW OF SYSTEMS: Review of Systems  Cardiovascular:  Negative for chest pain, claudication, dyspnea on exertion, irregular heartbeat, leg swelling, near-syncope, orthopnea, palpitations, paroxysmal nocturnal dyspnea and syncope.  Respiratory:  Negative for shortness of breath.   Hematologic/Lymphatic: Negative for bleeding problem.  Musculoskeletal:  Negative for muscle cramps and myalgias.  Neurological:  Negative for dizziness and light-headedness.    PHYSICAL EXAM:    03/04/2023    3:42 PM 03/01/2022    3:19 PM 03/01/2022    3:14 PM  Vitals with BMI  Height 5\' 9"   5\' 9"   Weight 183 lbs 6 oz  177 lbs  BMI 27.07  26.13  Systolic 134 151 409  Diastolic 88 91 88  Pulse 72 71 75   Physical Exam  Constitutional: No distress.  Age appropriate, hemodynamically stable.   Neck: No JVD present.  Cardiovascular: Normal rate, regular rhythm, S1 normal, S2 normal, intact distal pulses and normal pulses. Exam reveals no gallop, no  S3 and no S4.  No murmur heard. Pulmonary/Chest: Effort normal and breath sounds normal. No stridor. He has no wheezes. He has no rales.  Abdominal: Soft. Bowel sounds are normal. He exhibits no distension. There is no abdominal tenderness.  Musculoskeletal:        General: No edema.     Cervical back: Neck supple.  Neurological: He is alert and oriented to person, place, and time. He has intact cranial nerves (2-12).  Skin: Skin is warm and moist.    CARDIAC DATABASE: EKG: March 04, 2023: Sinus rhythm, 63 bpm, PAC, nonspecific T wave abnormality.  Echocardiogram: 11/29/2021:  Normal LV systolic function with visual EF 60-65%. Left ventricle cavity is normal in size. Normal left ventricular wall thickness. Normal global wall motion. Normal diastolic filling pattern, normal LAP.  Mild (Grade I) mitral regurgitation.  No prior study for comparison.    Stress Testing: Exercise Sestamibi stress test 01/08/2022: Exercise nuclear stress test was performed using Bruce protocol.   1 Day Rest and Stress images. Exercise time 6 minutes 43 seconds, achieved 8.14 METS, 99%APMHR.  Stress ECG negative for ischemia. Normal myocardial perfusion without convincing evidence of reversible myocardial ischemia or prior infarct. Left ventricular size is normal and wall thickness preserved without regional wall motion abnormalities. Calculated LVEF 81%. Low risk study.  Heart Catheterization: None  Calcium Score Report  11/27/2021: 1. Coronary calcium score of 55.7 is at the 78th percentile for the patient's age, sex and race.  2. Moderate hiatal hernia with irregular wall thickening of the herniated stomach possibly also involving the GE junction. Component of inflammation or neoplasm cannot be excluded. Correlation suggested with any symptoms. EGD may be of benefit in direct examination. 3. Evidence of multiple ill-defined liver lesions by unenhanced CT. See measurements above. Recommend a full contrast enhanced CT of the abdomen and pelvis to assess for liver lesions and possible source.  Carotid artery duplex 11/29/2021:  Duplex suggests stenosis in the right external carotid artery (<50%).  Duplex suggests stenosis in the left external carotid artery (<50%).  There is mild homogeneous plaque noted in the carotid arteries. External carotid stenosis source of bruit.  Antegrade right vertebral artery flow. Antegrade left vertebral artery flow.   Follow up in one year is appropriate if clinically indicated.  14 day extended Holter monitor: Dominant rhythm normal sinus rhythm. Heart rate 47-187 bpm.  Avg HR 79 bpm. No atrial fibrillation, ventricular tachycardia, high grade AV block, pauses (3 seconds or longer). Total ventricular ectopic burden <1%. Rare episodes of supraventricular tachycardia. Total supraventricular ectopic burden <1%. Patient triggered events: 2.  These did not correlate with any significant dysrhythmias.  LABORATORY DATA:  CBC with Diff   2020-11-15    WBC 6.3   4.0-11.0  RBC 4.73   4.20-5.80  HGB 14.5   13.0-17.0  HCT 43.8   39.0-52.0  MCV 92.5   80.0-94.0  MCH 30.5   27.0-33.0  MCHC 33.0   32.0-36.0  RDW 13.7   11.5-15.5  PLT 256   150-400  MPV 9.7   7.5-10.7  NE% 46.0   43.3-71.9  LY% 46.6   16.8-43.5  MO% 5.6   4.6-12.4  EO% 0.7   0.0-7.8  BA% 1.1   0.0-1.0  NE# 2.9   1.9-7.2  LY# 2.90   1.10-2.70  MO# 0.3   0.3-0.8  EO# 0.0   0.0-0.6  BA# 0.1   0.0-0.1  NRBC% 0.30      NRBC# 0.02  Comp Metabolic Panel   1610-96-04    Glucose 82   70-99  BUN 10   6-26  Creatinine 0.98   0.60-1.30  Sodium 142   136-145  Potassium 4.6   3.5-5.5  Chloride 100   98-107  CO2 34   22-32  Anion Gap 12.5   6.0-20.0  Calcium 10.5   8.6-10.3  CA-corrected 9.63   8.60-10.30  Protein, Total 7.3   6.0-8.3  Albumin 4.9   3.4-4.8  TBIL 1.6   0.3-1.0  ALP 53   38-126  AST 20   0-39  ALT 24   0-52  PSA   2020-11-15    PSA 0.69   0.01-4.00  TSH   2020-11-15    TSH 1.03   0.34-4.50  Lipid Panel w/reflex   2020-11-15    Cholesterol 228   <200  CHOL/HDL 4.9   2.0-4.0  HDLD 47   30-70  Triglyceride 151   0-199  NHDL 181   0-129  LDL Chol Calc (NIH) 154   5-40  LDL Chol Calc (NIH) 154   9-81   Calculated LDL 150mg /dL  External Labs: Collected: 11/24/2021 [facility] provided by the patient Total cholesterol 177, triglycerides 86, HDL 44, LDL 114, non-HDL 133. BUN 11, creatinine 0.95. Sodium 143,  potassium 4.1, chloride 105, bicarb 29. AST 17, ALT 18, alkaline phosphatase 53 Hemoglobin 13.6 g/dL, hematocrit 19.1%. TSH 1.02  Hemoglobin A1c 4.7  External Labs: Collected: August 09, 2022 at [facility], provided by the patient. BUN 8, creatinine 1.08. Sodium 145, potassium 4.6, chloride 104, bicarb 26 AST 22, ALT 23, alkaline phosphatase 57 Total cholesterol 163, triglycerides 147, HDL 40, LDL calculated 97    IMPRESSION:    ICD-10-CM   1. Coronary atherosclerosis due to calcified coronary lesion  I25.10 EKG 12-Lead   I25.84     2. Agatston coronary artery calcium score less than 100  R93.1     3. Pure hypercholesterolemia  E78.00        RECOMMENDATIONS: Tralyn Benitez is a 62 y.o.  male whose past medical history and cardiac risk factors include: Mild coronary artery calcification (total CAC 55.7, 78th percentile), hypertension, erectile dysfunction, hyperlipidemia.  Coronary atherosclerosis due to calcified coronary lesion Total CAC 55.7, 78th percentile. Currently on aspirin 81 mg p.o. daily. He would like to hold off initiating statin therapy and has been working on lifestyle changes. LDL levels have improved significantly with lifestyle changes, but still not at goal. Re discussed the role of statin/nonstatin medications but he would like to hold off on pharmacological therapy. Overall functional capacity has not changed. Currently follows a plant-based diet under the discretion/guidance of her sister who works for [employer].  Pure hypercholesterolemia His prior LDL levels were as high as 150 mg/dL based on the records available. Though he has mild coronary artery calcification is overall percentile is >75th and therefore statin medications were recommended.  As discussed above he remains reluctant to pharmacological therapy for lipid management. Currently implementing plant-based diet. Lipids as of December 2023 have noticed a significant improvement but he  understands that he is not at goal.  Continue conservative management as per patient. Scheduled to have repeat fasting lipids with PCP.  EKG ordered and independently reviewed which illustrates sinus rhythm with rare PACs.  Outside labs provided by the patient from December 2023 independently reviewed and noted above for further reference.  FINAL MEDICATION LIST END OF ENCOUNTER: No orders of the defined types were placed in this encounter.  There are no discontinued medications.     Current Outpatient Medications:    Fluticasone Propionate (XHANCE) 93 MCG/ACT EXHU, Place 2 puffs into both nostrils in the morning and at bedtime., Disp: 16 mL, Rfl: 12   hydrochlorothiazide (MICROZIDE) 12.5 MG capsule, TAKE 1 CAPSULE (12.5 MG TOTAL) BY MOUTH DAILY AT 2 PM., Disp: 30 capsule, Rfl: 1   ibuprofen (ADVIL) 600 MG tablet, Take 600 mg by mouth every 6 (six) hours as needed., Disp: , Rfl:    olmesartan (BENICAR) 40 MG tablet, TAKE 1 TABLET BY MOUTH EVERY DAY IN THE MORNING, Disp: 30 tablet, Rfl: 1   Turmeric (QC TUMERIC COMPLEX PO), , Disp: , Rfl:   Orders Placed This Encounter  Procedures   EKG 12-Lead   There are no Patient Instructions on file for this visit.   --Continue cardiac medications as reconciled in final medication list. --Return in about 1 year (around 03/03/2024) for Annual follow up visit, Coronary artery calcification. Or sooner if needed. --Continue follow-up with your primary care physician regarding the management of your other chronic comorbid conditions.  Patient's questions and concerns were addressed to his satisfaction. He voices understanding of the instructions provided during this encounter.   This note was created using a voice recognition software as a result there may be grammatical errors inadvertently enclosed that do not reflect the nature of this encounter. Every attempt is made to correct such errors.  Tessa Lerner, [place], [facility]  Pager:  [phone] Office:  [phone]

## 2023-03-16 ENCOUNTER — Other Ambulatory Visit: Payer: Self-pay | Admitting: Cardiology

## 2023-03-16 DIAGNOSIS — I10 Essential (primary) hypertension: Secondary | ICD-10-CM

## 2023-03-17 ENCOUNTER — Other Ambulatory Visit: Payer: Self-pay | Admitting: Cardiology

## 2023-03-17 DIAGNOSIS — I10 Essential (primary) hypertension: Secondary | ICD-10-CM

## 2023-04-23 ENCOUNTER — Encounter: Payer: Self-pay | Admitting: Physician Assistant

## 2023-09-10 ENCOUNTER — Other Ambulatory Visit: Payer: Self-pay | Admitting: Cardiology

## 2023-09-10 DIAGNOSIS — I10 Essential (primary) hypertension: Secondary | ICD-10-CM

## 2023-11-14 IMAGING — CT CT CARDIAC CORONARY ARTERY CALCIUM SCORE
3 series · 14 of 20 positions shown, 16 images · non-contrast
Comparison: None.

CLINICAL DATA: 61-year-old African American male with history of
hyperlipidemia and hypertension.

EXAM:
CT CARDIAC CORONARY ARTERY CALCIUM SCORE
TECHNIQUE: Non-contrast imaging through the heart was performed using
prospective ECG gating. Image post processing was performed on an
independent workstation, allowing for quantitative analysis of the
heart and coronary arteries. Note that this exam targets the heart
and the chest was not imaged in its entirety.

[Series 2: calcium scoring 2.00 qr36 bestdiast 70% hrt calciu · axial · 0.41mm/px · z∈[+1650,+1746]mm · 4 of 80 slices shown]
[im 16/80  vessel]
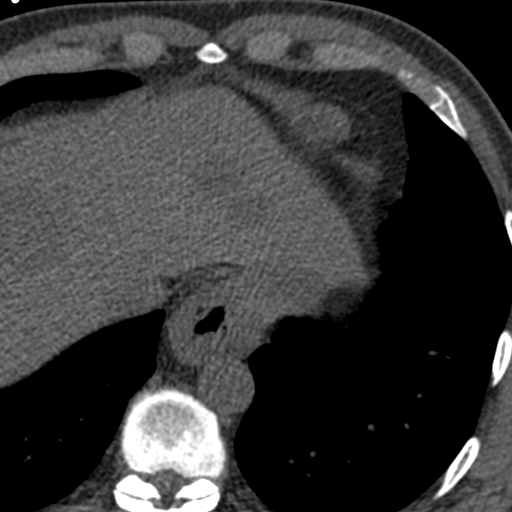
[im 32/80  vessel]
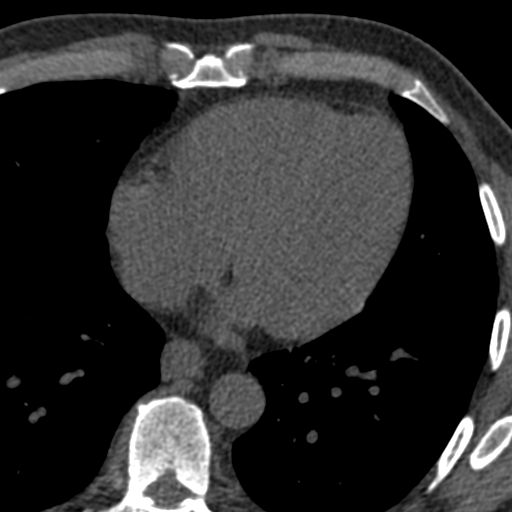
[im 48/80  vessel]
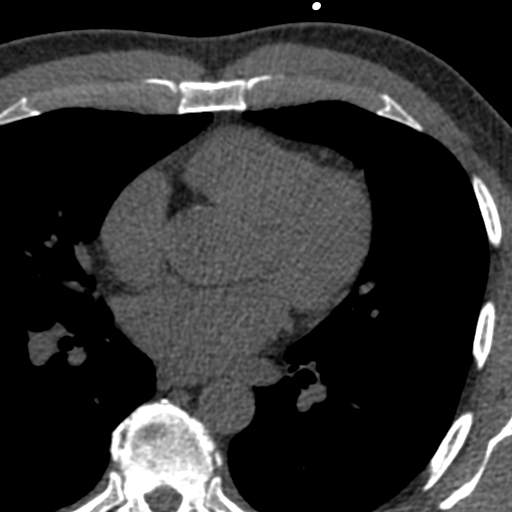
[im 64/80  vessel]
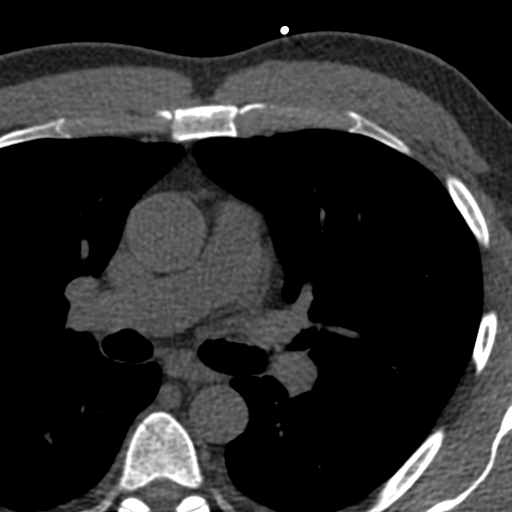

[Series 3: calcium scoring 2.00 br40 bestdiast 70% axial · axial · 0.57mm/px · z∈[+1646,+1750]mm · 5 of 80 slices shown, 7 images]
[im 14/80  vessel]
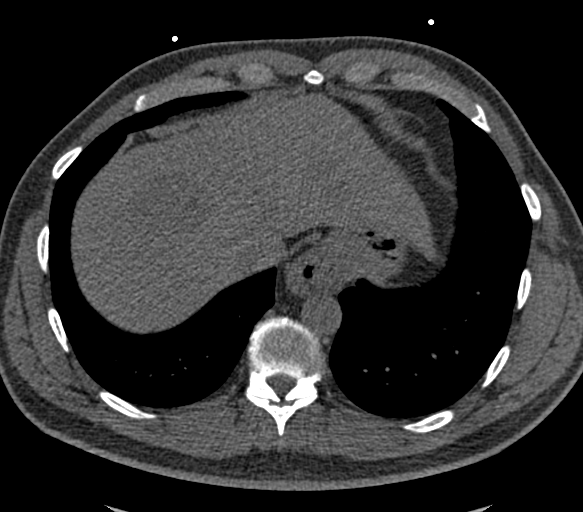
[im 14/80  lung]
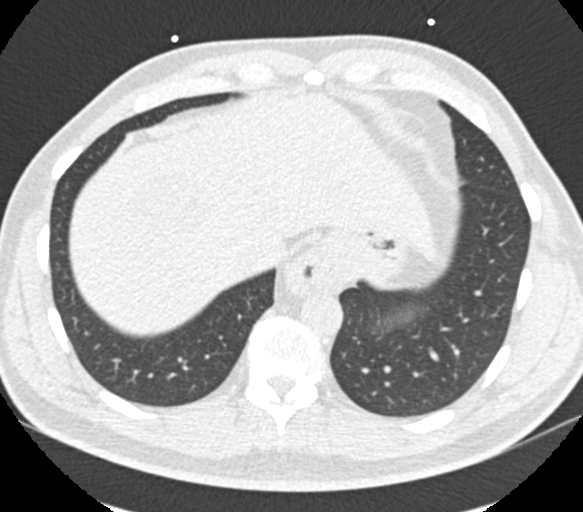
[im 27/80  vessel]
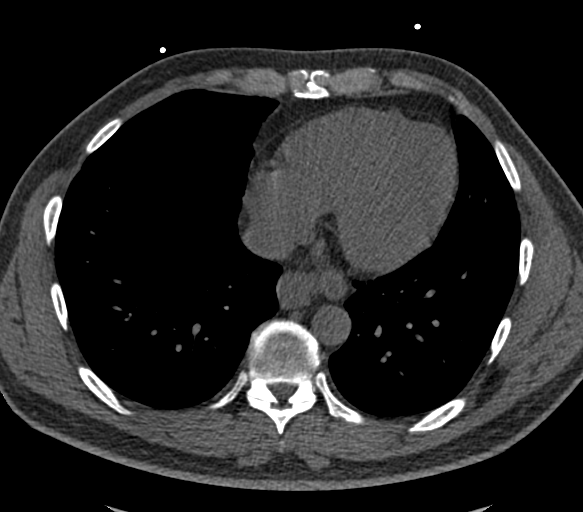
[im 40/80  vessel]
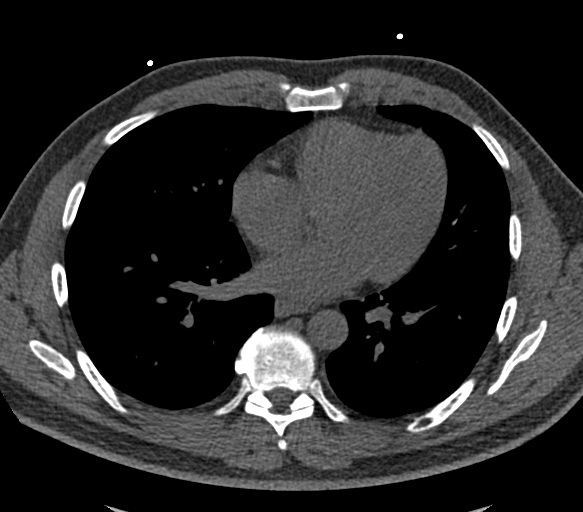
[im 53/80  vessel]
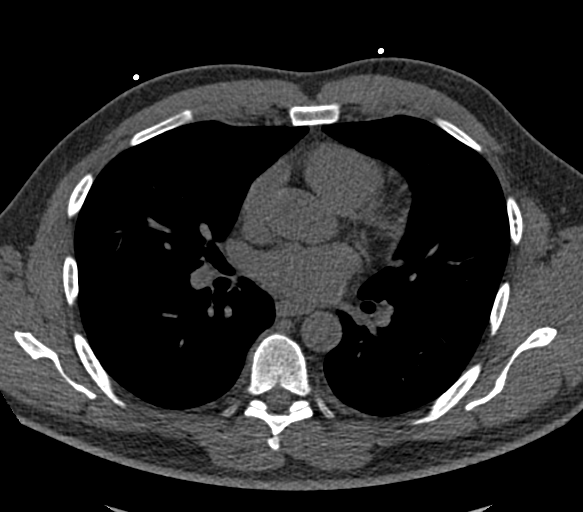
[im 66/80  vessel]
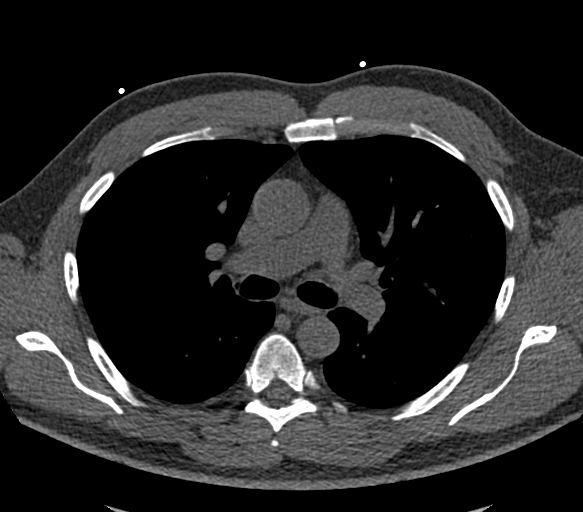
[im 66/80  lung]
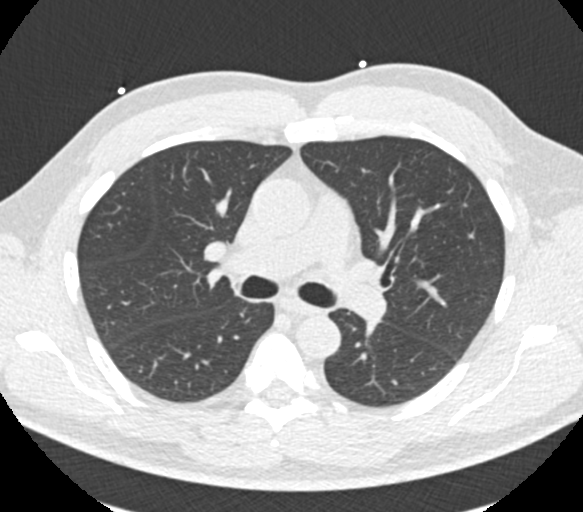

[Series 9: calcium scoring 2.00 br60 bestdiast 70% lungs · axial · 0.56mm/px · z∈[+1646,+1750]mm · 5 of 80 slices shown]
[im 14/80  vessel]
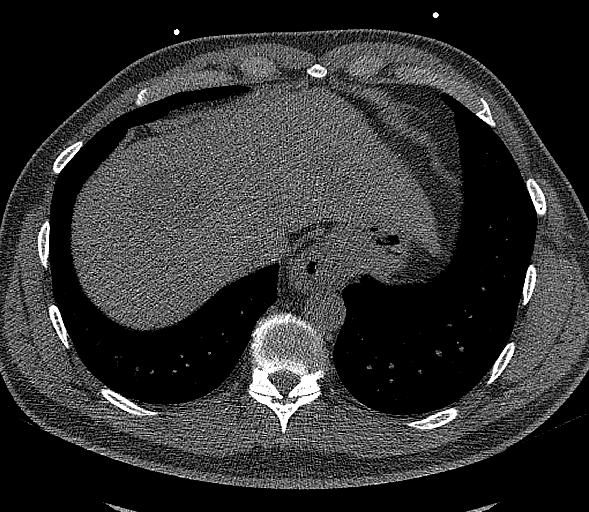
[im 27/80  vessel]
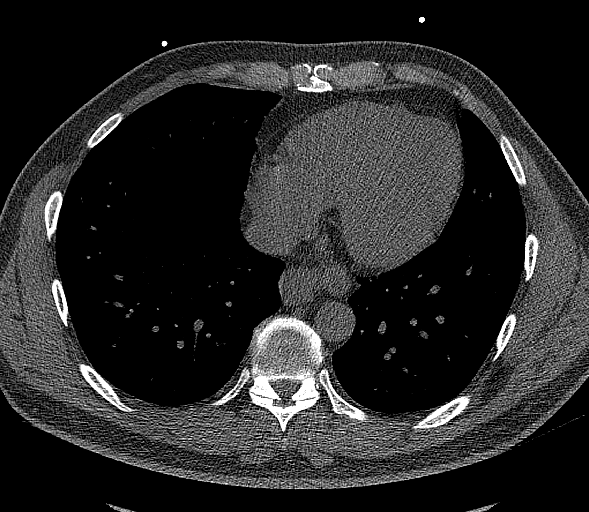
[im 40/80  vessel]
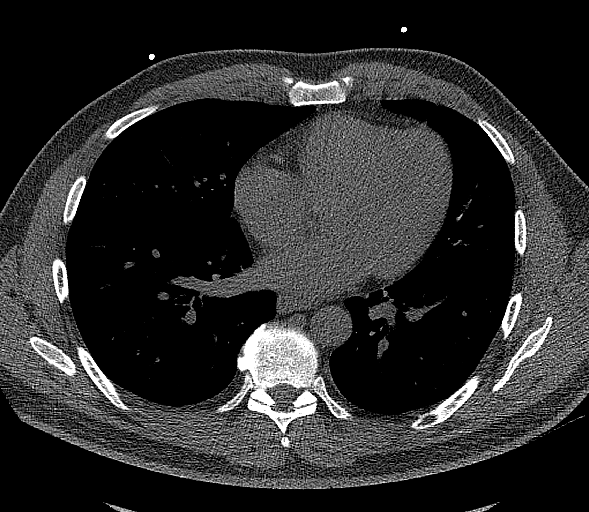
[im 53/80  vessel]
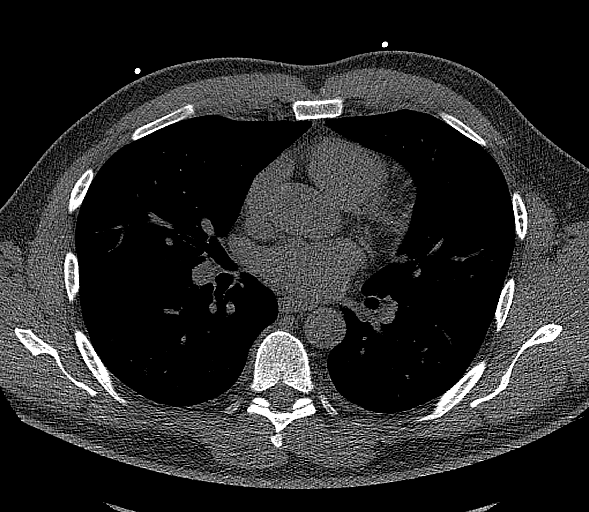
[im 66/80  vessel]
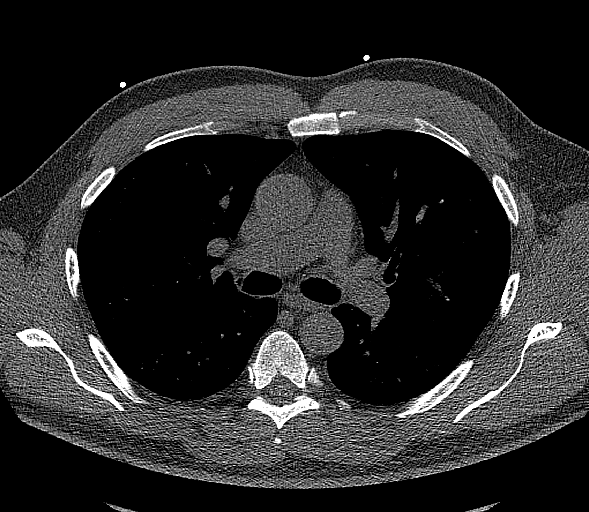

[14 of 20 positions shown; findings below may reference images not displayed]

FINDINGS: CORONARY CALCIUM SCORES:

Left Main: 0

LAD:

LCx: 0

RCA: 0

Total Agatston Score:

[HOSPITAL] percentile: 78

AORTA MEASUREMENTS:

Ascending Aorta: 31 mm

Descending Aorta: 23 mm

OTHER FINDINGS:

The heart size is within normal limits. No pericardial fluid is
identified. Visualized segments of the thoracic aorta and central
pulmonary arteries are normal in caliber. Visualized mediastinum and
hilar regions demonstrate no lymphadenopathy or masses. There is a
moderate-sized hiatal hernia with the herniated stomach
demonstrating irregular wall thickening measuring up to
approximately 1.5 cm. Component of inflammation or neoplasm cannot
be excluded. Visualized lungs show no evidence of pulmonary edema,
consolidation, pneumothorax, nodule or pleural fluid. Visualized
upper abdomen demonstrates what appear to be multiple masses in the
liver. In the superior right lobe near the dome is an area of
relative decreased attenuation measuring approximately 4.2 cm. Two
adjacent ill-defined possible lesions in the left lobe measure
roughly 1.7 cm and 1.6 cm respectively. Lesion in the subcapsular
anterior right lobe measures approximately 2.5 cm. There may be an
additional lesion in the left lobe measuring approximately 1.9 cm.
Visualized bony structures are unremarkable.
IMPRESSION: 1. Coronary calcium score of 55.7 is at the 78th percentile for the
patient's age, sex and race.
2. Moderate hiatal hernia with irregular wall thickening of the
herniated stomach possibly also involving the GE junction. Component
of inflammation or neoplasm cannot be excluded. Correlation
suggested with any symptoms. EGD may be of benefit in direct
examination.
3. Evidence of multiple ill-defined liver lesions by unenhanced CT.
See measurements above. Recommend a full contrast enhanced CT of the
abdomen and pelvis to assess for liver lesions and possible source.
4. These results will be called to the ordering clinician or
representative by the Radiologist Assistant, and communication
documented in the PACS or [REDACTED].

## 2023-12-02 IMAGING — CT CT ABD-PELV W/ CM
2 of 4 series · 12 of 32 positions shown, 18 images · IV contrast (agent unspecified)
Comparison: None.

CLINICAL DATA: Liver lesions seen on prev ct cardiac 11/27/21 No sx
No ca Non smoker

EXAM:
CT ABDOMEN AND PELVIS WITH CONTRAST
TECHNIQUE: Multidetector CT imaging of the abdomen and pelvis was performed
using the standard protocol following bolus administration of
intravenous contrast.

[Series 2: a/p w/ 5mm · axial · 0.76mm/px · z∈[-458,-28]mm · 10 of 106 slices shown, 16 images]
[im 10/106  soft-tissue]
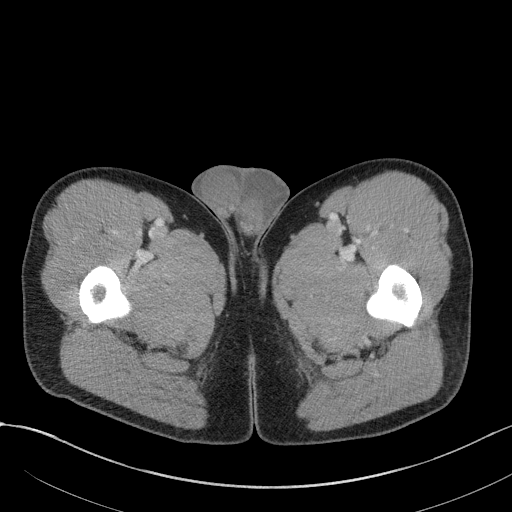
[im 10/106  bone]
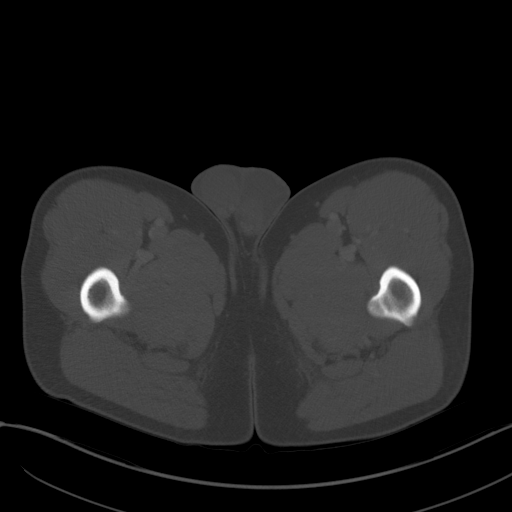
[im 20/106  soft-tissue]
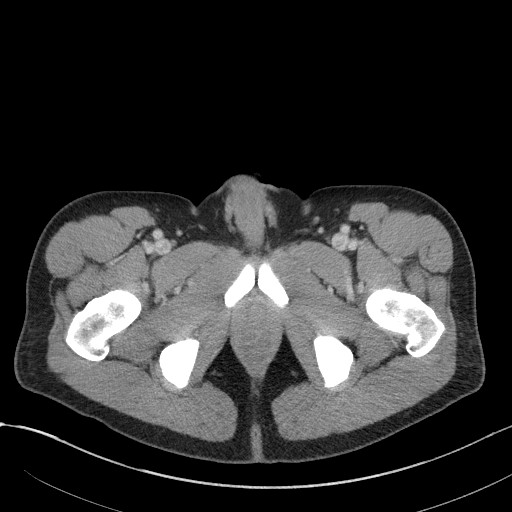
[im 29/106  soft-tissue]
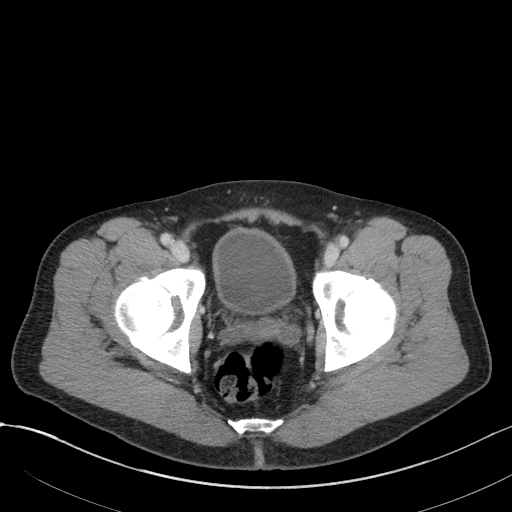
[im 39/106  soft-tissue]
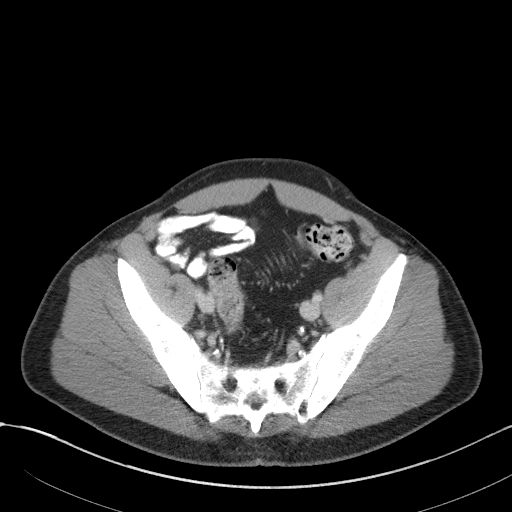
[im 48/106  soft-tissue]
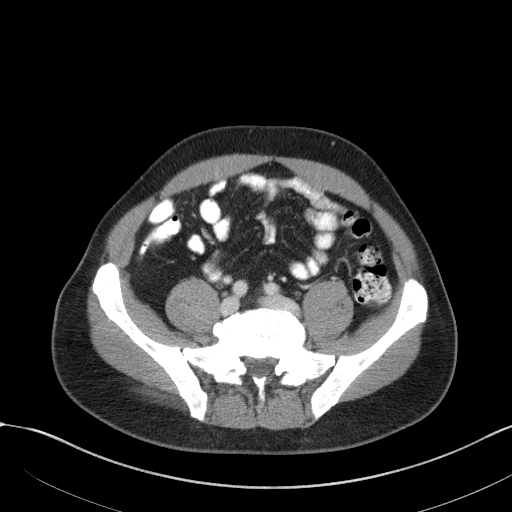
[im 58/106  soft-tissue]
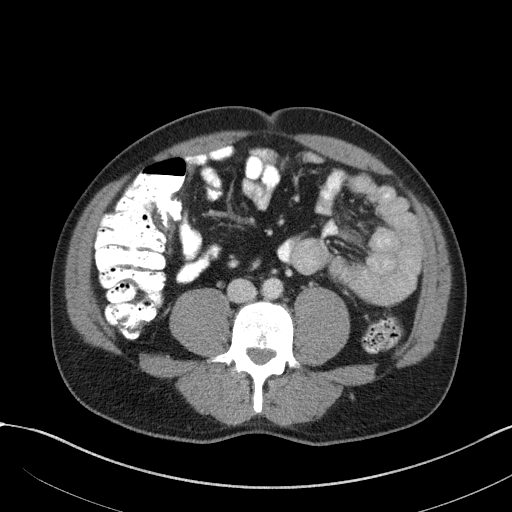
[im 67/106  soft-tissue]
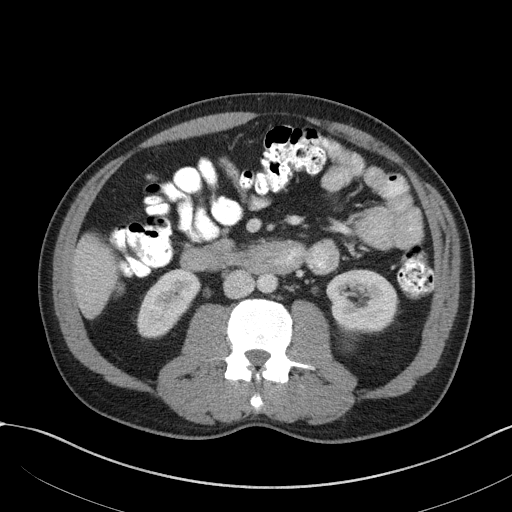
[im 67/106  lung]
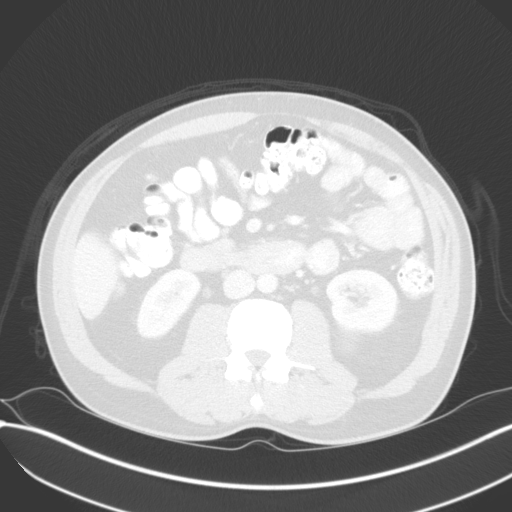
[im 77/106  soft-tissue]
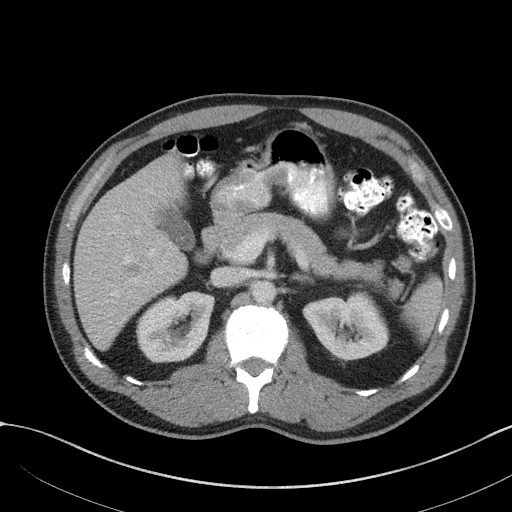
[im 77/106  lung]
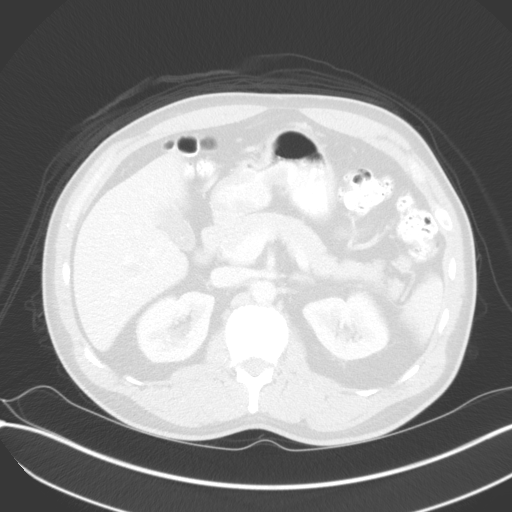
[im 86/106  soft-tissue]
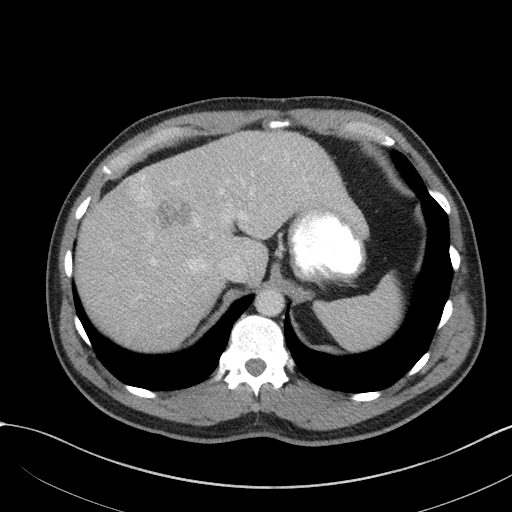
[im 86/106  lung]
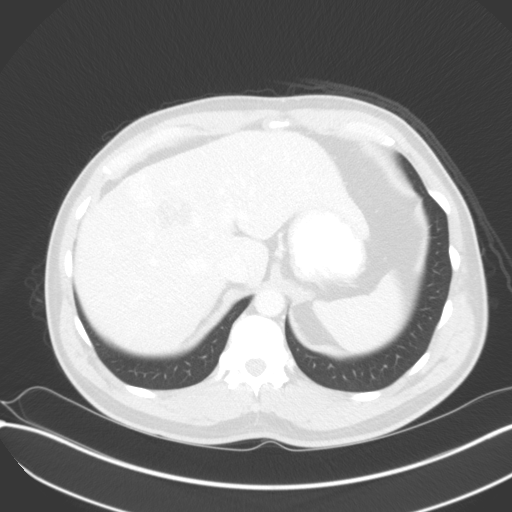
[im 86/106  bone]
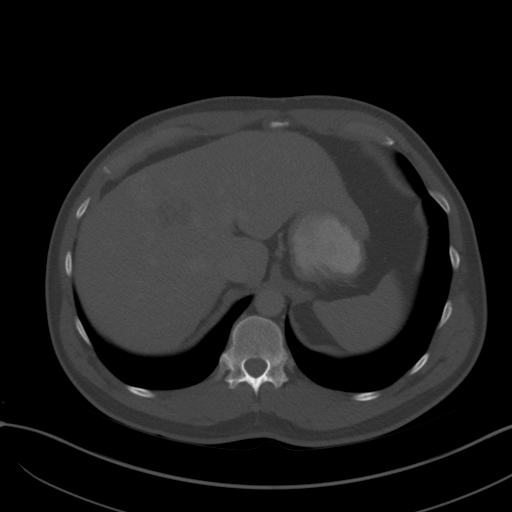
[im 96/106  soft-tissue]
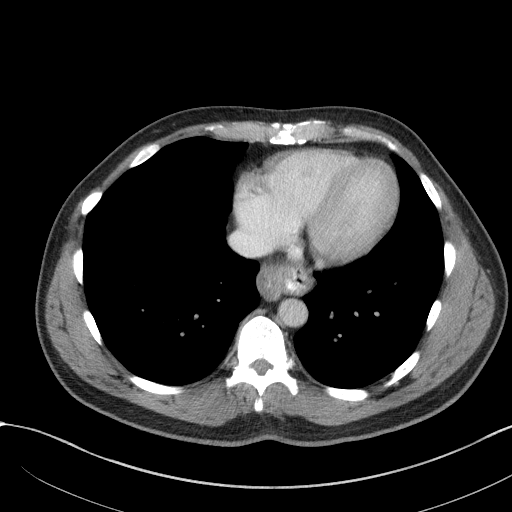
[im 96/106  lung]
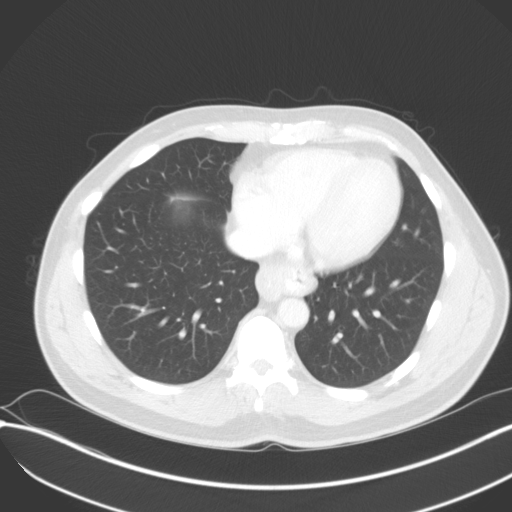

[Series 6: lung · axial · 0.76mm/px · z∈[-78,-28]mm · 2 of 30 slices shown]
[im 10/30  bone]
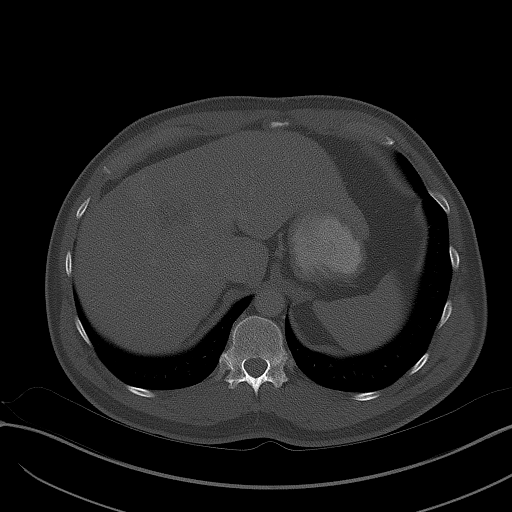
[im 20/30  bone]
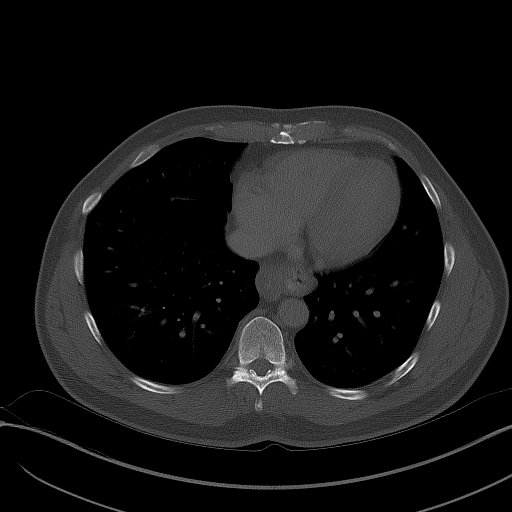

[12 of 32 positions shown; findings below may reference images not displayed]

RADIATION DOSE REDUCTION: This exam was performed according to the
departmental dose-optimization program which includes automated
exposure control, adjustment of the mA and/or kV according to
patient size and/or use of iterative reconstruction technique.

CONTRAST:  100mL Q224TS-OFF IOPAMIDOL (Q224TS-OFF) INJECTION 61%
FINDINGS: Lower chest:

Hepatobiliary: Total of 6 centrally hypodense and peripherally
discontinuously enhancing hepatic lesions that are noted to
partially or completely fill in on delayed) and furthermore on the
second delayed view) view likely representing hepatic hemangiomas.
The largest of these lesions measures 4.7 x 3.7 cm. No gallstones,
gallbladder wall thickening, or pericholecystic fluid. No biliary
dilatation.

Pancreas: No focal lesion. Normal pancreatic contour. No surrounding
inflammatory changes. No main pancreatic ductal dilatation.

Spleen: Normal in size without focal abnormality. A splenule is
noted.

Adrenals/Urinary Tract:

No adrenal nodule bilaterally.

Bilateral kidneys enhance symmetrically. There is a 4.2 cm fluid
density lesion within left kidney that likely represents a simple
renal cyst.

No hydronephrosis. No hydroureter.

The urinary bladder is unremarkable.

On delayed imaging, there is no urothelial wall thickening and there
are no filling defects in the opacified portions of the bilateral
collecting systems or ureters.

Stomach/Bowel: PO contrast reaches the colon. Stomach is within
normal limits. No evidence of bowel wall thickening or dilatation.
Scattered colonic diverticulosis. Appendix appears normal.

Vascular/Lymphatic: No abdominal aorta or iliac aneurysm. Mild
atherosclerotic plaque of the aorta and its branches. No abdominal,
pelvic, or inguinal lymphadenopathy.

Reproductive: Prostate is unremarkable.

Other: No intraperitoneal free fluid. No intraperitoneal free gas.
No organized fluid collection.

Musculoskeletal:

No abdominal wall hernia or abnormality.

No suspicious lytic or blastic osseous lesions. No acute displaced
fracture. Multilevel degenerative changes of the spine.
IMPRESSION: 1. Total of 6 hepatic lesions likely representing hepatic
hemangiomas.
2. Scattered colonic diverticulosis with no acute diverticulitis.
3.  Aortic Atherosclerosis ([id number]-GV4.4).

## 2024-03-03 ENCOUNTER — Ambulatory Visit: Payer: Self-pay | Admitting: Cardiology

## 2024-03-12 ENCOUNTER — Other Ambulatory Visit: Payer: Self-pay | Admitting: Cardiology

## 2024-03-12 DIAGNOSIS — I10 Essential (primary) hypertension: Secondary | ICD-10-CM

## 2024-09-15 ENCOUNTER — Other Ambulatory Visit: Payer: Self-pay | Admitting: Cardiology

## 2024-09-15 DIAGNOSIS — I10 Essential (primary) hypertension: Secondary | ICD-10-CM
